# Patient Record
Sex: Female | Born: 1990 | Race: White | Hispanic: No | Marital: Married | Smoking: Never smoker
Health system: Southern US, Community
[De-identification: ages and names within clinical notes are randomized; demographics above are authoritative.]

## PROBLEM LIST (undated history)

## (undated) ENCOUNTER — Emergency Department (HOSPITAL_COMMUNITY)

## (undated) DIAGNOSIS — R21 Rash and other nonspecific skin eruption: Secondary | ICD-10-CM

## (undated) DIAGNOSIS — J309 Allergic rhinitis, unspecified: Secondary | ICD-10-CM

## (undated) DIAGNOSIS — K589 Irritable bowel syndrome without diarrhea: Secondary | ICD-10-CM

## (undated) DIAGNOSIS — Z789 Other specified health status: Secondary | ICD-10-CM

## (undated) DIAGNOSIS — E739 Lactose intolerance, unspecified: Secondary | ICD-10-CM

## (undated) HISTORY — DX: Irritable bowel syndrome, unspecified: K58.9

## (undated) HISTORY — DX: Rash and other nonspecific skin eruption: R21

## (undated) HISTORY — DX: Lactose intolerance, unspecified: E73.9

## (undated) HISTORY — PX: MOUTH SURGERY: SHX715

## (undated) HISTORY — DX: Allergic rhinitis, unspecified: J30.9

## (undated) HISTORY — PX: UPPER GASTROINTESTINAL ENDOSCOPY: SHX188

## (undated) HISTORY — PX: TONSILLECTOMY: SUR1361

## (undated) HISTORY — PX: WISDOM TOOTH EXTRACTION: SHX21

---
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
## 2012-11-11 ENCOUNTER — Ambulatory Visit (INDEPENDENT_AMBULATORY_CARE_PROVIDER_SITE_OTHER): Payer: Self-pay | Admitting: Otolaryngology

## 2012-12-03 ENCOUNTER — Ambulatory Visit: Payer: Self-pay | Admitting: Unknown Physician Specialty

## 2012-12-16 ENCOUNTER — Observation Stay: Payer: Self-pay | Admitting: Otolaryngology

## 2015-04-13 NOTE — Op Note (Signed)
PATIENT NAME:  Janet Gilmore, Rashaunda MR#:  [id number] DATE OF BIRTH:  1991-07-09  DATE OF PROCEDURE:  12/16/2012.  SURGEON: Zackery BarefootJ. Madison Desmen Schoffstall, M.D.   PREOPERATIVE DIAGNOSIS: Post- tonsillectomy hemorrhage.  POSTOPERATIVE DIAGNOSIS: Post- tonsillectomy hemorrhage.  PROCEDURE: Control of post- tonsillectomy hemorrhage.   DESCRIPTION OF PROCEDURE: The patient was placed in the supine position on the Operating Room table. After general endotracheal anesthesia had been induced, the patient was turned approximately 90 degrees clockwise from Anesthesia. A Dingman mouth retractor was placed and there was a large clot in the right inferior pole of the post tonsillectomy eschar. The clot was removed. There was brisk bleeding from a venous source, and this was cauterized with suction cautery. A Valsalva maneuver was performed by Anesthesia, no further bleeding was encountered. Therefore, attention was directed to sucking out the blood out of the stomach. Approximately 200 mL of black succus entericus was removed. The oropharynx was again irrigated. Nasopharynx was irrigated. A slight ooze from the junction between the right tongue base and pharyngeal wall. This was cauterized. No additional bleeding was encountered. Saline was used again to irrigate out the oropharynx. The patient was then returned to Anesthesia, allowed to emerge from anesthesia in the Operating Room, and taken to the Recovery Room in stable condition. There were no complications. Estimated blood loss 15 mL in the oropharynx and 200 mL in the stomach.   Of note, the patient has a bifid uvula.   ____________________________ J. Gertie BaronMadison Butch Otterson, MD [email or web address] D: 12/16/2012 21:14:44 ET T: 12/17/2012 10:47:17 ET JOB#: [id number]  cc: Zackery BarefootJ. Madison Markasia Carrol, MD, <Dictator> Wendee CoppJMADISON Tawfiq Favila MD ELECTRONICALLY SIGNED 12/27/2012 8:22

## 2017-11-07 ENCOUNTER — Other Ambulatory Visit: Payer: Self-pay | Admitting: Obstetrics and Gynecology

## 2017-12-12 ENCOUNTER — Other Ambulatory Visit: Payer: Self-pay | Admitting: Obstetrics and Gynecology

## 2017-12-30 ENCOUNTER — Ambulatory Visit (INDEPENDENT_AMBULATORY_CARE_PROVIDER_SITE_OTHER): Payer: BC Managed Care – PPO | Admitting: Obstetrics and Gynecology

## 2017-12-30 ENCOUNTER — Encounter: Payer: Self-pay | Admitting: Obstetrics and Gynecology

## 2017-12-30 VITALS — BP 124/62 | HR 76 | Ht 64.0 in | Wt 145.0 lb

## 2017-12-30 DIAGNOSIS — Z01419 Encounter for gynecological examination (general) (routine) without abnormal findings: Secondary | ICD-10-CM | POA: Diagnosis not present

## 2017-12-30 DIAGNOSIS — N921 Excessive and frequent menstruation with irregular cycle: Secondary | ICD-10-CM

## 2017-12-30 DIAGNOSIS — Z3041 Encounter for surveillance of contraceptive pills: Secondary | ICD-10-CM | POA: Diagnosis not present

## 2017-12-30 DIAGNOSIS — Z124 Encounter for screening for malignant neoplasm of cervix: Secondary | ICD-10-CM

## 2017-12-30 MED ORDER — LEVONORGESTREL-ETHINYL ESTRAD 0.1-20 MG-MCG PO TABS: 1.0000 | ORAL_TABLET | Freq: Every day | ORAL | 12 refills | Status: DC

## 2017-12-30 NOTE — Patient Instructions (Signed)
I value your feedback and entrusting us with your care. If you get a [facility] patient survey, I would appreciate you taking the time to let us know about your experience today. Thank you! 

## 2017-12-30 NOTE — Progress Notes (Signed)
PCP:  Carylon PerchesFagan, Roy, MD   Chief Complaint  Patient presents with  . Gynecologic Exam    piercing headaches 1-2 days before cycle  . Rash    Rashes/ bumps all over body     HPI:      [name] is a 27 y.o. G0P0000 who LMP was Patient's last menstrual period was 12/12/2017 (exact date)., presents today for her annual examination.  Her menses are regular every 28-30 days, lasting 5 days.  Dysmenorrhea none. She did have 1 wk of intermenstrual bleeding without late/missed OCPs last month. She has also had 2 months of menstrual headaches day before period started, relieved with NSAIDs--new sx for pt.   Sex activity: single partner, contraception - OCP (estrogen/progesterone).  Last Pap: December 11, 2016  Results were: no abnormalities  Hx of STDs: none  There is a FH of breast cancer in her PGM, genetic testing not indicated. There is no FH of ovarian cancer. The patient does not do self-breast exams.  Tobacco use: The patient denies current or previous tobacco use. Alcohol use: social drinker No drug use.  Exercise: moderately active  She does get adequate calcium and Vitamin D in her diet.   Past Medical History:  Diagnosis Date  . IBS (irritable bowel syndrome)   . Lactose intolerance   . Rash     Past Surgical History:  Procedure Laterality Date  . MOUTH SURGERY    . TONSILLECTOMY    . WISDOM TOOTH EXTRACTION      Family History  Problem Relation Age of Onset  . Lymphoma Father   . Breast cancer Paternal Grandmother [id number]    Social History   Socioeconomic History  . Marital status: Single    Spouse name: Not on file  . Number of children: Not on file  . Years of education: Not on file  . Highest education level: Not on file  Social Needs  . Financial resource strain: Not on file  . Food insecurity - worry: Not on file  . Food insecurity - inability: Not on file  . Transportation needs - medical: Not on file  . Transportation needs - non-medical:  Not on file  Occupational History  . Not on file  Tobacco Use  . Smoking status: Never Smoker  . Smokeless tobacco: Never Used  Substance and Sexual Activity  . Alcohol use: Yes  . Drug use: No  . Sexual activity: Yes    Partners: Male    Birth control/protection: Pill  Other Topics Concern  . Not on file  Social History Narrative  . Not on file    Current Meds  Medication Sig  . hyoscyamine (LEVSIN, ANASPAZ) 0.125 MG tablet Take by mouth.  . levonorgestrel-ethinyl estradiol (VIENVA) 0.1-20 MG-MCG tablet Take 1 tablet by mouth daily.  . [DISCONTINUED] VIENVA 0.1-20 MG-MCG tablet TAKE 1 TABLET BY MOUTH ONCE DAILY     ROS:  Review of Systems  Constitutional: Negative for fatigue, fever and unexpected weight change.  Respiratory: Negative for cough, shortness of breath and wheezing.   Cardiovascular: Negative for chest pain, palpitations and leg swelling.  Gastrointestinal: Negative for blood in stool, constipation, diarrhea, nausea and vomiting.  Endocrine: Negative for cold intolerance, heat intolerance and polyuria.  Genitourinary: Negative for dyspareunia, dysuria, flank pain, frequency, genital sores, hematuria, menstrual problem, pelvic pain, urgency, vaginal bleeding, vaginal discharge and vaginal pain.  Musculoskeletal: Negative for back pain, joint swelling and myalgias.  Skin: Negative for rash.  Neurological:  Positive for headaches. Negative for dizziness, syncope, light-headedness and numbness.  Hematological: Negative for adenopathy.  Psychiatric/Behavioral: Negative for agitation, confusion, sleep disturbance and suicidal ideas. The patient is not nervous/anxious.      Objective: BP 124/62 (BP Location: Left Arm, Patient Position: Sitting, Cuff Size: Normal)   Pulse 76   Ht 5\' 4"  (1.626 m)   Wt 145 lb (65.8 kg)   LMP 12/12/2017 (Exact Date)   BMI 24.89 kg/m    Physical Exam  Constitutional: She is oriented to person, place, and time. She appears  well-developed and well-nourished.  Genitourinary: Vagina normal and uterus normal. There is no rash or tenderness on the right labia. There is no rash or tenderness on the left labia. No erythema or tenderness in the vagina. No vaginal discharge found. Right adnexum does not display mass and does not display tenderness. Left adnexum does not display mass and does not display tenderness. Cervix does not exhibit motion tenderness or polyp. Uterus is not enlarged or tender.  Neck: Normal range of motion. No thyromegaly present.  Cardiovascular: Normal rate, regular rhythm and normal heart sounds.  No murmur heard. Pulmonary/Chest: Effort normal and breath sounds normal. Right breast exhibits no mass, no nipple discharge, no skin change and no tenderness. Left breast exhibits no mass, no nipple discharge, no skin change and no tenderness.  Abdominal: Soft. There is no tenderness. There is no guarding.  Musculoskeletal: Normal range of motion.  Neurological: She is alert and oriented to person, place, and time. No cranial nerve deficit.  Psychiatric: She has a normal mood and affect. Her behavior is normal.  Vitals reviewed.   Assessment/Plan: Encounter for annual routine gynecological examination  Cervical cancer screening - Plan: IGP, rfx Aptima HPV ASCU  Encounter for surveillance of contraceptive pills - OCP RF. - Plan: levonorgestrel-ethinyl estradiol (VIENVA) 0.1-20 MG-MCG tablet  Breakthrough bleeding on birth control pills - 1 mo only. F/u if sx persist for BC change.  Meds ordered this encounter  Medications  . levonorgestrel-ethinyl estradiol (VIENVA) 0.1-20 MG-MCG tablet    Sig: Take 1 tablet by mouth daily.    Dispense:  28 tablet    Refill:  12             GYN counsel adequate intake of calcium and vitamin D, diet and exercise     F/U  Return in about 1 year (around 12/30/2018).  Alicia B. Copland, PA-C 12/30/2017 4:45 PM

## 2018-01-01 LAB — IGP, RFX APTIMA HPV ASCU: PAP Smear Comment: 0

## 2018-02-25 ENCOUNTER — Ambulatory Visit (HOSPITAL_COMMUNITY)
Admission: RE | Admit: 2018-02-25 | Discharge: 2018-02-25 | Disposition: A | Discharge status: Home / Self Care | Payer: BC Managed Care – PPO | Source: Ambulatory Visit | Attending: Internal Medicine | Admitting: Internal Medicine

## 2018-02-25 ENCOUNTER — Other Ambulatory Visit (HOSPITAL_COMMUNITY): Payer: Self-pay | Admitting: Internal Medicine

## 2018-02-25 DIAGNOSIS — R1031 Right lower quadrant pain: Secondary | ICD-10-CM

## 2018-03-10 ENCOUNTER — Emergency Department
Admission: EM | Admit: 2018-03-10 | Discharge: 2018-03-10 | Disposition: A | Discharge status: Home / Self Care | Payer: BC Managed Care – PPO | Attending: Emergency Medicine | Admitting: Emergency Medicine

## 2018-03-10 ENCOUNTER — Encounter: Payer: Self-pay | Admitting: Emergency Medicine

## 2018-03-10 DIAGNOSIS — R197 Diarrhea, unspecified: Secondary | ICD-10-CM | POA: Insufficient documentation

## 2018-03-10 DIAGNOSIS — R1084 Generalized abdominal pain: Secondary | ICD-10-CM | POA: Insufficient documentation

## 2018-03-10 DIAGNOSIS — R112 Nausea with vomiting, unspecified: Secondary | ICD-10-CM | POA: Diagnosis present

## 2018-03-10 DIAGNOSIS — Z79899 Other long term (current) drug therapy: Secondary | ICD-10-CM | POA: Insufficient documentation

## 2018-03-10 LAB — COMPREHENSIVE METABOLIC PANEL
ALT: 17 U/L (ref 14–54)
AST: 24 U/L (ref 15–41)
Albumin: 4.7 g/dL (ref 3.5–5.0)
Alkaline Phosphatase: 57 U/L (ref 38–126)
Anion gap: 8 (ref 5–15)
BUN: 10 mg/dL (ref 6–20)
CO2: 26 mmol/L (ref 22–32)
Calcium: 9.3 mg/dL (ref 8.9–10.3)
Chloride: 105 mmol/L (ref 101–111)
Creatinine, Ser: 0.62 mg/dL (ref 0.44–1.00)
GFR calc Af Amer: >60 mL/min (ref >60)
GFR calc non Af Amer: >60 mL/min (ref >60)
Glucose, Bld: 103 mg/dL — ABNORMAL HIGH (ref 65–99)
Potassium: 4.4 mmol/L (ref 3.5–5.1)
Sodium: 139 mmol/L (ref 135–145)
Total Bilirubin: 0.6 mg/dL (ref 0.3–1.2)
Total Protein: 8.2 g/dL — ABNORMAL HIGH (ref 6.5–8.1)

## 2018-03-10 LAB — CBC
HCT: 44.1 % (ref 35.0–47.0)
Hemoglobin: 14.8 g/dL (ref 12.0–16.0)
MCH: 29.7 pg (ref 26.0–34.0)
MCHC: 33.5 g/dL (ref 32.0–36.0)
MCV: 88.7 fL (ref 80.0–100.0)
Platelets: 410 10*3/uL (ref 150–440)
RBC: 4.97 MIL/uL (ref 3.80–5.20)
RDW: 13.2 % (ref 11.5–14.5)
WBC: 8.9 10*3/uL (ref 3.6–11.0)

## 2018-03-10 LAB — URINALYSIS, COMPLETE (UACMP) WITH MICROSCOPIC
Bacteria, UA: NONE SEEN
Bilirubin Urine: NEGATIVE
Glucose, UA: NEGATIVE mg/dL
Hgb urine dipstick: NEGATIVE
Ketones, ur: NEGATIVE mg/dL
Leukocytes, UA: NEGATIVE
Nitrite: NEGATIVE
Protein, ur: NEGATIVE mg/dL
RBC / HPF: NONE SEEN RBC/hpf (ref 0–5)
Specific Gravity, Urine: 1.016 (ref 1.005–1.030)
WBC, UA: NONE SEEN WBC/hpf (ref 0–5)
pH: 7 (ref 5.0–8.0)

## 2018-03-10 LAB — PREGNANCY, URINE: Preg Test, Ur: NEGATIVE

## 2018-03-10 LAB — LIPASE, BLOOD: Lipase: 31 U/L (ref 11–51)

## 2018-03-10 MED ORDER — ONDANSETRON 4 MG PO TBDP
4.0000 mg | ORAL_TABLET | Freq: Once | ORAL | Status: AC
  Administered 2018-03-10: 4 mg via ORAL
  Filled 2018-03-10: qty 1

## 2018-03-10 MED ORDER — IBUPROFEN 600 MG PO TABS
600.0000 mg | ORAL_TABLET | Freq: Once | ORAL | Status: AC
  Administered 2018-03-10: 600 mg via ORAL
  Filled 2018-03-10: qty 1

## 2018-03-10 MED ORDER — ONDANSETRON 4 MG PO TBDP: 4.0000 mg | ORAL_TABLET | Freq: Three times a day (TID) | ORAL | 0 refills | Status: DC | PRN

## 2018-03-10 NOTE — ED Notes (Signed)
Dr. Don PerkingVeronese was discussing pt plan of care about IVF. Pt denies wanting an IV d/t being a hard stick. Discussed oral hydration with pt and she is agreeable to that.

## 2018-03-10 NOTE — ED Provider Notes (Signed)
[facility] Emergency Department Provider Note  ____________________________________________  Time seen: Approximately 11:37 AM  I have reviewed the triage vital signs and the nursing notes.   HISTORY  Chief Complaint Abdominal Pain   HPI Janet Gilmore is a 27 y.o. female with h/o IBS who presents for abdominal pain. Patient reports her symptoms have been intermittent since December, started again this morning, one episode of NBNB emesis and one watery diarrhea this am. No fever, chills, dysuria, vaginal discharge or bleeding. Pain is 4/10 crampy diffuse in her abdomen and constant since this am. Pain identical to prior episodes since December. Had CT 2 weeks ago showing constipation and mobile cecum. Has appointment with GI in 2 days. No prior abdominal surgeries. patient reports that she called her primary care doctor today who told her to come to the emergency room because she was vomiting.  Past Medical History:  Diagnosis Date  . IBS (irritable bowel syndrome)   . Lactose intolerance   . Rash     There are no active problems to display for this patient.   Past Surgical History:  Procedure Laterality Date  . MOUTH SURGERY    . TONSILLECTOMY    . WISDOM TOOTH EXTRACTION      Prior to Admission medications   Medication Sig Start Date End Date Taking? Authorizing Provider  albuterol (PROVENTIL HFA;VENTOLIN HFA) 108 (90 Base) MCG/ACT inhaler Inhale 1-2 puffs into the lungs every 6 (six) hours as needed for wheezing or shortness of breath.   Yes [provider]  hyoscyamine (LEVSIN, ANASPAZ) 0.125 MG tablet Take 0.125 mg by mouth daily.    Yes [provider]  levonorgestrel-ethinyl estradiol (VIENVA) 0.1-20 MG-MCG tablet Take 1 tablet by mouth daily. 12/30/17  Yes Copland, Ilona Sorrel, PA-C  Probiotic Product (PROBIOTIC DAILY PO) Take 1 tablet by mouth daily.   Yes [provider]  ondansetron (ZOFRAN ODT) 4 MG  disintegrating tablet Take 1 tablet (4 mg total) by mouth every 8 (eight) hours as needed for nausea or vomiting. 03/10/18   Nita Sickle, MD    Allergies Patient has no known allergies.  Family History  Problem Relation Age of Onset  . Lymphoma Father   . Breast cancer Paternal Grandmother 59    Social History Social History   Tobacco Use  . Smoking status: Never Smoker  . Smokeless tobacco: Never Used  Substance Use Topics  . Alcohol use: Yes  . Drug use: No    Review of Systems  Constitutional: Negative for fever. Eyes: Negative for visual changes. ENT: Negative for sore throat. Neck: No neck pain  Cardiovascular: Negative for chest pain. Respiratory: Negative for shortness of breath. Gastrointestinal: + abdominal pain, vomiting and diarrhea. Genitourinary: Negative for dysuria. Musculoskeletal: Negative for back pain. Skin: Negative for rash. Neurological: Negative for headaches, weakness or numbness. Psych: No SI or HI  ____________________________________________   PHYSICAL EXAM:  VITAL SIGNS: ED Triage Vitals  Enc Vitals Group     BP 03/10/18 0955 (!) 145/99     Pulse Rate 03/10/18 0955 92     Resp 03/10/18 0955 20     Temp 03/10/18 0959 98.4 F (36.9 C)     Temp Source 03/10/18 0959 Oral     SpO2 03/10/18 0955 100 %     Weight 03/10/18 0955 145 lb (65.8 kg)     Height --      Head Circumference --      Peak Flow --  Pain Score 03/10/18 0955 3     Pain Loc --      Pain Edu? --      Excl. in GC? --     Constitutional: Alert and oriented. Well appearing and in no apparent distress. HEENT:      Head: Normocephalic and atraumatic.         Eyes: Conjunctivae are normal. Sclera is non-icteric.       Mouth/Throat: Mucous membranes are moist.       Neck: Supple with no signs of meningismus. Cardiovascular: Regular rate and rhythm. No murmurs, gallops, or rubs. 2+ symmetrical distal pulses are present in all extremities. No  JVD. Respiratory: Normal respiratory effort. Lungs are clear to auscultation bilaterally. No wheezes, crackles, or rhonchi.  Gastrointestinal: Soft, diffuse mild tenderness to palpation, and non distended with positive bowel sounds. No rebound or guarding. Genitourinary: No CVA tenderness. Musculoskeletal: Nontender with normal range of motion in all extremities. No edema, cyanosis, or erythema of extremities. Neurologic: Normal speech and language. Face is symmetric. Moving all extremities. No gross focal neurologic deficits are appreciated. Skin: Skin is warm, dry and intact. No rash noted. Psychiatric: Mood and affect are normal. Speech and behavior are normal.  ____________________________________________   LABS (all labs ordered are listed, but only abnormal results are displayed)  Labs Reviewed  COMPREHENSIVE METABOLIC PANEL - Abnormal; Notable for the following components:      Result Value   Glucose, Bld 103 (*)    Total Protein 8.2 (*)    All other components within normal limits  URINALYSIS, COMPLETE (UACMP) WITH MICROSCOPIC - Abnormal; Notable for the following components:   Color, Urine YELLOW (*)    APPearance CLEAR (*)    Squamous Epithelial / LPF 0-5 (*)    All other components within normal limits  LIPASE, BLOOD  CBC  PREGNANCY, URINE   ____________________________________________  EKG  none ____________________________________________  RADIOLOGY  none  ____________________________________________   PROCEDURES  Procedure(s) performed: None Procedures Critical Care performed:  None ____________________________________________   INITIAL IMPRESSION / ASSESSMENT AND PLAN / ED COURSE  27 y.o. female with h/o IBS who presents for abdominal pain intermittent since December. Patient had a CT scan 2 weeks ago showing constipation and mobile cecum. Pain is identical to prior episodes. Vitals are within normal limits, abdomen is soft with mild diffuse  tenderness, no localized tenderness, rebound or guarding. Labs show no white count, and normal CMP, lipase, urinalysis. negative pregnancy test. At this time will believe patient's symptoms are either from gastroenteritis vs IBS flare. No clinical suspicion for ovarian pathology, gallbladder pathology, appendicitis, diverticulitis. Do not believe patient warrants any further imaging at this time since her symptoms have been ongoing since December and she had a unremarkable CT 2 weeks ago. I explained to her signs and symptoms of appendicitis, gallbladder, and ovarian pathology and recommended that she returns to the emergency room if these develop. Otherwise she will follow-up with her GI doctor in 2 days on her already scheduled appointment. I offered to put an IV to give her fluids, Toradol and Zofran. Patient refused IV and preferred to be treated orally. Will give ibuprofen, Zofran and by mouth challenge. If patient passes by mouth challenge anticipate discharge home.  Clinical Course as of Mar 10 1942  Wed Mar 10, 2018  1258 patient tolerating by mouth. Remains well appearing with no focal tenderness on abdominal exam. At this time she is safe for discharge home. We'll provide a prescription of Zofran.  Discussed strict return precautions and close follow-up with her GI doctor.  [CV]    Clinical Course User Index [CV] Don PerkingVeronese, [place], MD     As part of my medical decision making, I reviewed the following data within the electronic MEDICAL RECORD NUMBER Nursing notes reviewed and incorporated, Labs reviewed , Notes from prior ED visits and [place] Controlled Substance Database    Pertinent labs & imaging results that were available during my care of the patient were reviewed by me and considered in my medical decision making (see chart for details).    ____________________________________________   FINAL CLINICAL IMPRESSION(S) / ED DIAGNOSES  Final diagnoses:  Nausea vomiting and diarrhea   Generalized abdominal pain      NEW MEDICATIONS STARTED DURING THIS VISIT:  ED Discharge Orders        Ordered    ondansetron (ZOFRAN ODT) 4 MG disintegrating tablet  Every 8 hours PRN     03/10/18 1259       Note:  This document was prepared using Dragon voice recognition software and may include unintentional dictation errors.    Don PerkingVeronese, [place], MD 03/10/18 [id number]

## 2018-03-10 NOTE — ED Triage Notes (Signed)
Pt with abd pain started last night, pt with hx of IBS.

## 2018-03-10 NOTE — ED Notes (Addendum)
Pt states abd pain RLQ and upper middle and some LLQ. States started in December, has seen multiple doctors about it. Pain has become consistent past few weeks. States had a CT from PCP done and was told stool in colon and mobile cecum. States over night had vomiting and diarrhea. PCP told pt to take dulcolax for 4 days. Pt admits to IBS. Denies constipation. Has consult on Friday with GI doctor. Mom states pt feels hot. Pt states BM have been "soft solid" not just liquid. Describes pain as cramping.

## 2018-03-10 NOTE — ED Notes (Signed)
Pt given ginger ale for PO hydration.

## 2018-03-10 NOTE — ED Notes (Signed)
First Nurse Note:  Patient complaining of abdominal pain, states Dr. [place]agan [place] advised her to come to ED.  Alert and oriented.

## 2018-03-10 NOTE — Discharge Instructions (Signed)

## 2018-03-10 NOTE — ED Notes (Signed)
Pt ambulatory to treatment room, no distress noted.   

## 2018-03-12 ENCOUNTER — Ambulatory Visit: Payer: BC Managed Care – PPO | Admitting: Gastroenterology

## 2018-03-12 ENCOUNTER — Encounter: Payer: Self-pay | Admitting: Gastroenterology

## 2018-03-12 VITALS — BP 116/70 | HR 80 | Ht 63.58 in | Wt 145.2 lb

## 2018-03-12 DIAGNOSIS — R112 Nausea with vomiting, unspecified: Secondary | ICD-10-CM | POA: Diagnosis not present

## 2018-03-12 DIAGNOSIS — R194 Change in bowel habit: Secondary | ICD-10-CM | POA: Diagnosis not present

## 2018-03-12 DIAGNOSIS — R1084 Generalized abdominal pain: Secondary | ICD-10-CM

## 2018-03-12 MED ORDER — GLYCOPYRROLATE 1 MG PO TABS: 1.0000 mg | ORAL_TABLET | Freq: Two times a day (BID) | ORAL | 11 refills | Status: DC

## 2018-03-12 NOTE — Progress Notes (Signed)
History of Present Illness: This is a 27 year old female referred by Carylon PerchesFagan, Roy, MD for the evaluation of abdominal pain. She has a history of diarrhea typically occurring after meals for the past 3 years and this has not changed.  She did not note abdominal pain until December. She relates intermittent episodes of diffuse, crampy abdominal pain since December. She has had 2 episodes of N/V associated with more severe episodes of abdominal pain.  She relates frequent abdominal bloating since December.  CMP, CBC, lipase, UA, urine [facility] test [facility] 2 days ago - all unremarkable. She was given ibuprofen and Zofran. CT as below. Denies weight loss, change in stool caliber, melena, hematochezia, dysphagia, reflux symptoms, chest pain.  Abd/pelvic CT 02/25/2018: Normal unenhanced CT of the abdomen and pelvis. Large stool burden throughout the normal appearing colon. Mobile cecum extends nearly to the midline of the lower pelvis.    No Known Allergies Outpatient Medications Prior to Visit  Medication Sig Dispense Refill  . hyoscyamine (LEVSIN, ANASPAZ) 0.125 MG tablet Take 0.125 mg by mouth daily.     Marland Kitchen. levocetirizine (XYZAL) 5 MG tablet Take 5 mg by mouth daily.    Marland Kitchen. levonorgestrel-ethinyl estradiol (VIENVA) 0.1-20 MG-MCG tablet Take 1 tablet by mouth daily. 28 tablet 12  . Multiple Vitamin (MULTIVITAMIN) tablet Take 1 tablet by mouth daily.    . Probiotic Product (PROBIOTIC DAILY PO) Take 1 tablet by mouth daily.    Marland Kitchen. albuterol (PROVENTIL HFA;VENTOLIN HFA) 108 (90 Base) MCG/ACT inhaler Inhale 1-2 puffs into the lungs every 6 (six) hours as needed for wheezing or shortness of breath.    . ondansetron (ZOFRAN ODT) 4 MG disintegrating tablet Take 1 tablet (4 mg total) by mouth every 8 (eight) hours as needed for nausea or vomiting. (Patient not taking: Reported on 03/12/2018) 20 tablet 0   No facility-administered medications prior to visit.    Past Medical History:  Diagnosis Date  .  Allergic rhinitis   . IBS (irritable bowel syndrome)   . Lactose intolerance    symptamatic but not diagnosed  . Rash    Past Surgical History:  Procedure Laterality Date  . MOUTH SURGERY     x 3  . TONSILLECTOMY    . WISDOM TOOTH EXTRACTION     Social History   Socioeconomic History  . Marital status: Single    Spouse name: Not on file  . Number of children: 0  . Years of education: Not on file  . Highest education level: Not on file  Occupational History  . Occupation: Runner, broadcasting/film/videoteacher  Social Needs  . Financial resource strain: Not on file  . Food insecurity:    Worry: Not on file    Inability: Not on file  . Transportation needs:    Medical: Not on file    Non-medical: Not on file  Tobacco Use  . Smoking status: Never Smoker  . Smokeless tobacco: Never Used  Substance and Sexual Activity  . Alcohol use: Yes    Comment: soacial  . Drug use: No  . Sexual activity: Yes    Partners: Male    Birth control/protection: Pill  Lifestyle  . Physical activity:    Days per week: 2 days    Minutes per session: 40 min  . Stress: To some extent  Relationships  . Social connections:    Talks on phone: More than three times a week    Gets together: Once a week  Attends religious service: More than 4 times per year    Active member of club or organization: Yes    Attends meetings of clubs or organizations: More than 4 times per year    Relationship status: Never married  Other Topics Concern  . Not on file  Social History Narrative  . Not on file   Family History  Problem Relation Age of Onset  . Non-Hodgkin's lymphoma Father   . Colon polyps Father   . Breast cancer Paternal Grandmother 15  . Colon polyps Mother   . Irritable bowel syndrome Mother   . GER disease Brother   . Dementia Maternal Grandmother   . Heart disease Maternal Grandfather   . Diabetes Paternal Grandfather      Review of Systems: Pertinent positive and negative review of systems were noted in  the above HPI section. All other review of systems were otherwise negative.   Physical Exam: General: Well developed, well nourished, no acute distress Head: Normocephalic and atraumatic Eyes:  sclerae anicteric, EOMI Ears: Normal auditory acuity Mouth: No deformity or lesions Neck: Supple, no masses or thyromegaly Lungs: Clear throughout to auscultation Heart: Regular rate and rhythm; no murmurs, rubs or bruits Abdomen: Soft, diffuse mild tender and non distended. No masses, hepatosplenomegaly or hernias noted. Normal Bowel sounds Rectal: not done Musculoskeletal: Symmetrical with no gross deformities  Skin: No lesions on visible extremities Pulses:  Normal pulses noted Extremities: No clubbing, cyanosis, edema or deformities noted Neurological: Alert oriented x 4, grossly nonfocal Cervical Nodes:  No significant cervical adenopathy Inguinal Nodes: No significant inguinal adenopathy Psychological:  Alert and cooperative. Anxious.   Assessment and Recommendations:  1. Intermittent abdominal pain, abdominal bloating, change in bowel habits. 2 episodes of nausea and vomiting likely related to abdominal pain.  Even though she relates postprandial loose stools her CT scan indicates that her colon is not completely evacuating.  Likely has constipation, incomplete fecal evacuation. Begin MiraLAX 2-3 times daily over the weekend and then once daily titrated for complete bowel movement each day.  Begin glycopyrrolate 1 mg twice daily.  May use hyoscyamine as needed.  Call for further advice if symptoms not under good control next week. REV in 1 month.    cc: Carylon Perches, MD [address] [place], [place] [address]

## 2018-03-12 NOTE — Patient Instructions (Signed)
If you are age 27 or older, your body mass index should be between 23-30. Your Body mass index is 25.26 kg/m. If this is out of the aforementioned range listed, please consider follow up with your Primary Care Provider.  If you are age 27 or younger, your body mass index should be between 19-25. Your Body mass index is 25.26 kg/m. If this is out of the aformentioned range listed, please consider follow up with your Primary Care Provider.   We have sent the following medications to your pharmacy for you to pick up at your convenience: Robinul 1mg  twice daily   Please purchase the following medications over the counter and take as directed: Miralax- Use 2-3 times a day throughout the weekend and then start once daily.

## 2018-03-16 ENCOUNTER — Other Ambulatory Visit: Payer: Self-pay

## 2018-03-16 ENCOUNTER — Telehealth: Payer: Self-pay | Admitting: Gastroenterology

## 2018-03-16 MED ORDER — PLECANATIDE 3 MG PO TABS: 3.0000 mg | ORAL_TABLET | Freq: Every day | ORAL | 1 refills | Status: DC

## 2018-03-16 NOTE — Telephone Encounter (Signed)
Trulance 3 mg po daily. Stop Miralax if having diarrhea.

## 2018-03-16 NOTE — Telephone Encounter (Signed)
Patient okay'd to leave voice message. LVM that I called in Trulance Rx for her, she should stop the Miralax if she starts having diarrhea. Asked to her to give it a good week to see how this medication is working for her, call if symptoms not improved.

## 2018-03-16 NOTE — Telephone Encounter (Signed)
Patient states she took 5 doses of Miralax on Saturday, felt completely cleaned out. She had a normal bm on Sunday night. She has been taking miralax daily, taking her glycopyrrolate and hyoscyamine. She is still continuing to have intermittent, generalized abdominal pain. Today it was RUQ pain 1.5 hours after eating. Please advise.

## 2018-03-17 ENCOUNTER — Ambulatory Visit: Payer: BC Managed Care – PPO | Admitting: Physician Assistant

## 2018-03-17 ENCOUNTER — Encounter: Payer: Self-pay | Admitting: Physician Assistant

## 2018-03-17 ENCOUNTER — Telehealth: Payer: Self-pay

## 2018-03-17 VITALS — BP 120/64 | HR 80 | Ht 63.58 in | Wt 145.4 lb

## 2018-03-17 DIAGNOSIS — R112 Nausea with vomiting, unspecified: Secondary | ICD-10-CM

## 2018-03-17 DIAGNOSIS — R1013 Epigastric pain: Secondary | ICD-10-CM

## 2018-03-17 DIAGNOSIS — R109 Unspecified abdominal pain: Secondary | ICD-10-CM | POA: Diagnosis not present

## 2018-03-17 MED ORDER — PANTOPRAZOLE SODIUM 40 MG PO TBEC: 40.0000 mg | DELAYED_RELEASE_TABLET | Freq: Every day | ORAL | 3 refills | Status: DC

## 2018-03-17 NOTE — Progress Notes (Signed)
Subjective:    Patient ID: Janet Gilmore, female    DOB: [birth year], 27 y.o.   MRN: [id number]  HPI Janet Gilmore is a pleasant 27 year old white female, known just recently to Dr. Russella DarStark and seen on 03/12/2018 for complaints of intermittent abdominal pain bloating and change in bowel habits.  She had also had a couple of episodes of nausea and vomiting.  Symptoms had started in December. She comes back in today for follow-up with ongoing pain. 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  She has had recent labs which were unremarkable including pregnancy test. When she was seen in the office on 03/12/2018 she was started with treatment for constipation and asked to purge her bowel with MiraLAX and she says she took 5 doses of MiraLAX this past weekend and one day and had multiple bowel movements to the point of body liquid stool.  She says that did not change her pain symptoms.  She is also been using Robinul 1 mg twice daily in addition to hyoscyamine as needed. He has continued on MiraLAX once daily this week.  She did have a good bowel movement yesterday. Patient says she had an acute episode of intense sharp stabbing type pain in the upper abdomen that awakened her from sleep at about 3 AM last night and lasted till about 5 AM.  She says these episodes are intense and cause her to double over with pain.  She has not had any radiation into her chest or back.  Also just this week she is started having some separate right lateral abdominal discomfort off and on throughout the day.  She did become lot nauseated early this morning with this episode and vomited. She also had a similar episode last Wednesday, 03/10/2018 and awakening her in the middle of the night lasting for a couple of hours and associated with nausea and vomiting of which she  says was undigested food. Patient says she has been having some milder stabbing pain off and on throughout the day but her worst episodes have been during the night. She has not been using any aspirin or NSAIDs, no regular EtOH and denies any marijuana use.  Review of Systems Pertinent positive and negative review of systems were noted in the above HPI section.  All other review of systems was otherwise negative.  Outpatient Encounter Medications as of 03/17/2018  Medication Sig  . hyoscyamine (LEVSIN, ANASPAZ) 0.125 MG tablet Take 0.125 mg by mouth daily.   Marland Kitchen. levocetirizine (XYZAL) 5 MG tablet Take 5 mg by mouth daily.  Marland Kitchen. levonorgestrel-ethinyl estradiol (VIENVA) 0.1-20 MG-MCG tablet Take 1 tablet by mouth daily.  . Multiple Vitamin (MULTIVITAMIN) tablet Take 1 tablet by mouth daily.  . Probiotic Product (PROBIOTIC DAILY PO) Take 1 tablet by mouth daily.  Marland Kitchen. albuterol (PROVENTIL HFA;VENTOLIN HFA) 108 (90 Base) MCG/ACT inhaler Inhale 1-2 puffs into the lungs every 6 (six) hours as needed for wheezing or shortness of breath.  Marland Kitchen. glycopyrrolate (ROBINUL) 1 MG tablet Take 1 tablet (1 mg total) by mouth 2 (two) times daily. (Patient not taking: Reported on 03/17/2018)  . ondansetron (ZOFRAN ODT) 4 MG disintegrating tablet Take 1 tablet (4 mg total) by mouth every 8 (eight) hours as needed for nausea or vomiting. (Patient not taking: Reported on 03/12/2018)  . Plecanatide (TRULANCE) 3 MG  TABS Take 3 mg by mouth daily. (Patient not taking: Reported on 03/17/2018)   No facility-administered encounter medications on file as of 03/17/2018.    No Known Allergies There are no active problems to display for this patient.  Social History   Socioeconomic History  . Marital status: Single    Spouse name: Not on file  . Number of children: 0  . Years of education: Not on file  . Highest education level: Not on file  Occupational History  . Occupation: Runner, broadcasting/film/video  Social Needs  . Financial resource strain:  Not on file  . Food insecurity:    Worry: Not on file    Inability: Not on file  . Transportation needs:    Medical: Not on file    Non-medical: Not on file  Tobacco Use  . Smoking status: Never Smoker  . Smokeless tobacco: Never Used  Substance and Sexual Activity  . Alcohol use: Yes    Comment: soacial  . Drug use: No  . Sexual activity: Yes    Partners: Male    Birth control/protection: Pill  Lifestyle  . Physical activity:    Days per week: 2 days    Minutes per session: 40 min  . Stress: To some extent  Relationships  . Social connections:    Talks on phone: More than three times a week    Gets together: Once a week    Attends religious service: More than 4 times per year    Active member of club or organization: Yes    Attends meetings of clubs or organizations: More than 4 times per year    Relationship status: Never married  . Intimate partner violence:    Fear of current or ex partner: No    Emotionally abused: No    Physically abused: No    Forced sexual activity: No  Other Topics Concern  . Not on file  Social History Narrative  . Not on file    Janet Gilmore's family history includes Breast cancer (age of onset: 39) in her paternal grandmother; Colon polyps in her father and mother; Dementia in her maternal grandmother; Diabetes in her paternal grandfather; GER disease in her brother; Heart disease in her maternal grandfather; Irritable bowel syndrome in her mother; Non-Hodgkin's lymphoma in her father.      Objective:    Vitals:   03/17/18 1500  BP: 120/64  Pulse: 80    Physical Exam; well-developed young white female in no acute distress, pleasant blood pressure 120/64 pulse 80, height 5 foot 3, weight 145, BMI 25.2.  HEENT ;nontraumatic normocephalic EOMI PERRLA sclera anicteric, Cardiovascular; regular rate and rhythm with S1-S2 no murmur rub or gallop, Pulmonary ;clear bilaterally, Abdomen ;soft, non-distended, no palpable mass or hepatosplenomegaly,  bowel sounds are present she is tender in the epigastrium and right upper quadrant no guarding or rebound, Rectal ;exam not done, Extremities ;no clubbing cyanosis or edema skin warm dry, Neuro psych; mood and affect appropriate       Assessment & Plan:   #59 27 year old white female with 3-34-month history of intermittent abdominal pain now with episodes of intense upper abdominal pain described as stabbing in nature usually occurring in the middle of the night and associated with nausea and vomiting.  She has had 2 episodes over the past week. She was compliant with purging her bowel but did not have any change in symptoms. Do not think these intense episodes are secondary to IBS. Will rule out gallbladder disease,  gastritis or peptic ulcer disease  #2 constipation-patient had good result with bowel purge with MiraLAX.  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 Russella Dar.  Procedure was discussed in detail with the patient including indications risks and benefits and she is agreeable to proceed Start Protonix 40 mg p.o. every morning AC breakfast She will continue glycopyrrolate 1 mg p.o. twice daily Advised her to continue MiraLAX 17 g in 8 ounces of water daily or every other day, and will hold off on Trulance in the short-term. Bland diet Further plans pending results of above.  Jamieon Lannen Oswald Hillock PA-C 03/17/2018   Cc: Carylon Perches, MD

## 2018-03-17 NOTE — Telephone Encounter (Signed)
Received fax from [facility] stating PA for Trulance 3 mg is approved from 03/17/18-03/17/2021 for idiopathic constipation.

## 2018-03-17 NOTE — Telephone Encounter (Signed)
Patient reports that she was awakened in the middle of the night with terrible abdominal pain and still is having pain.  She reports pain is an 8-10 and had vomiting earlier this am as well.  She will come in and see Amy Esterwood PA today at 3:00

## 2018-03-17 NOTE — Patient Instructions (Signed)
Normal BMI (Body Mass Index- based on height and weight) is between 19 and 25. Your BMI today is Body mass index is 25.28 kg/m. Marland Kitchen. Please consider follow up  regarding your BMI with your Primary Care Provider.  You have been scheduled for an endoscopy. Please follow written instructions given to you at your visit today. If you use inhalers (even only as needed), please bring them with you on the day of your procedure. Your physician has requested that you go to www.startemmi.com and enter the access code given to you at your visit today. This web site gives a general overview about your procedure. However, you should still follow specific instructions given to you by our office regarding your preparation for the procedure.  We have sent the following medications to your pharmacy for you to pick up at your convenience:  [facility] have [facility] of [facility] on 03/22/18 at 10:00am. Please arrive 15 minutes prior to your appointment for registration. Make certain not to have anything to eat or drink 6 hours prior to your appointment. Should you need to reschedule your appointment, please contact radiology at [phone]. This test typically takes about 30 minutes to perform.

## 2018-03-17 NOTE — Telephone Encounter (Signed)
Patient calling back stating last night she woke from a deep sleep around 3am and was in pain at level 10/10. Patient states she also had BMs every hour until about 5 and then threw up. Pt seems to think this is urgent and would like to speak with nurse again. Patient has not picked up new medication yet to try.

## 2018-03-18 NOTE — Progress Notes (Signed)
Reviewed and agree with initial management plan.  Malcolm T. Stark, MD FACG 

## 2018-03-19 ENCOUNTER — Ambulatory Visit (AMBULATORY_SURGERY_CENTER): Payer: BC Managed Care – PPO | Admitting: Gastroenterology

## 2018-03-19 ENCOUNTER — Other Ambulatory Visit: Payer: Self-pay

## 2018-03-19 ENCOUNTER — Encounter: Payer: Self-pay | Admitting: Gastroenterology

## 2018-03-19 VITALS — BP 115/62 | HR 77 | Temp 99.1°F | Resp 17 | Ht 63.0 in | Wt 145.0 lb

## 2018-03-19 DIAGNOSIS — R112 Nausea with vomiting, unspecified: Secondary | ICD-10-CM

## 2018-03-19 DIAGNOSIS — R1013 Epigastric pain: Secondary | ICD-10-CM

## 2018-03-19 MED ORDER — SODIUM CHLORIDE 0.9 % IV SOLN: 500.0000 mL | Freq: Once | INTRAVENOUS | Status: DC

## 2018-03-19 NOTE — Patient Instructions (Addendum)
YOU HAD AN ENDOSCOPIC PROCEDURE TODAY AT THE [facility]:   Refer to the procedure report that was given to you for any specific questions about what was found during the examination.  If the procedure report does not answer your questions, please call your gastroenterologist to clarify.  If you requested that your care partner not be given the details of your procedure findings, then the procedure report has been included in a sealed envelope for you to review at your convenience later.  YOU SHOULD EXPECT: Some feelings of bloating in the abdomen. Passage of more gas than usual.  Walking can help get rid of the air that was put into your GI tract during the procedure and reduce the bloating. If you had a lower endoscopy (such as a colonoscopy or flexible sigmoidoscopy) you may notice spotting of blood in your stool or on the toilet paper. If you underwent a bowel prep for your procedure, you may not have a normal bowel movement for a few days.  Please Note:  You might notice some irritation and congestion in your nose or some drainage.  This is from the oxygen used during your procedure.  There is no need for concern and it should clear up in a day or so.  SYMPTOMS TO REPORT IMMEDIATELY:   Following upper endoscopy (EGD)  Vomiting of blood or coffee ground material  New chest pain or pain under the shoulder blades  Painful or persistently difficult swallowing  New shortness of breath  Fever of 100F or higher  Black, tarry-looking stools  For urgent or emergent issues, a gastroenterologist can be reached at any hour by calling [phone].   DIET:  We do recommend a small meal at first, but then you may proceed to your regular diet.  Drink plenty of fluids but you should avoid alcoholic beverages for 24 hours.  ACTIVITY:  You should plan to take it easy for the rest of today and you should NOT DRIVE or use heavy machinery until tomorrow (because of the sedation medicines used  during the test).    FOLLOW UP: Our staff will call the number listed on your records the next business day following your procedure to check on you and address any questions or concerns that you may have regarding the information given to you following your procedure. If we do not reach you, we will leave a message.  However, if you are feeling well and you are not experiencing any problems, there is no need to return our call.  We will assume that you have returned to your regular daily activities without incident.  If any biopsies were taken you will be contacted by phone or by letter within the next 1-3 weeks.  Please call us at [phone] if you have not heard about the biopsies in 3 weeks.    SIGNATURES/CONFIDENTIALITY: You and/or your care partner have signed paperwork which will be entered into your electronic medical record.  These signatures attest to the fact that that the information above on your After Visit Summary has been reviewed and is understood.  Full responsibility of the confidentiality of this discharge information lies with you and/or your care-partner.   Thank you for allowing us to provide your healthcare today.  

## 2018-03-19 NOTE — Progress Notes (Signed)
History reviewed today 

## 2018-03-19 NOTE — Progress Notes (Signed)
Report to PACU, RN, vss, BBS= Clear.  

## 2018-03-19 NOTE — Op Note (Signed)
[facility] Patient Name: Janet Gilmore Procedure Date: 03/19/2018 3:09 PM MRN: [id number] Endoscopist: Meryl DareMalcolm T Gali Spinney , MD Age: [age] Referring MD:  Date of Birth: November 26, 1991 Gender: Female Account #: [id number] Procedure:                Upper GI endoscopy Indications:              Epigastric abdominal pain, Nausea with vomiting Medicines:                Monitored Anesthesia Care Procedure:                Pre-Anesthesia Assessment:                           - Prior to the procedure, a History and Physical                            was performed, and patient medications and                            allergies were reviewed. The patient's tolerance of                            previous anesthesia was also reviewed. The risks                            and benefits of the procedure and the sedation                            options and risks were discussed with the patient.                            All questions were answered, and informed consent                            was obtained. Prior Anticoagulants: The patient has                            taken no previous anticoagulant or antiplatelet                            agents. ASA Grade Assessment: II - A patient with                            mild systemic disease. After reviewing the risks                            and benefits, the patient was deemed in                            satisfactory condition to undergo the procedure.                           After obtaining informed consent, the endoscope was  passed under direct vision. Throughout the                            procedure, the patient's blood pressure, pulse, and                            oxygen saturations were monitored continuously. The                            Endoscope was introduced through the mouth, and                            advanced to the second part of duodenum. The upper                            GI  endoscopy was accomplished without difficulty.                            The patient tolerated the procedure well. Scope In: Scope Out: Findings:                 The esophagus was normal.                           The stomach was normal.                           The examined duodenum was normal.                           The cardia and gastric fundus were normal on                            retroflexion. Complications:            No immediate complications. Estimated Blood Loss:     Estimated blood loss: none. Impression:               - Normal esophagus.                           - Normal stomach.                           - Normal examined duodenum.                           - No specimens collected. Recommendation:           - Patient has a contact number available for                            emergencies. The signs and symptoms of potential                            delayed complications were discussed with the  patient. Return to normal activities tomorrow.                            Written discharge instructions were provided to the                            patient.                           - Resume previous diet.                           - Continue present medications.                           - Abd [place] as scheduled. Meryl Dare, MD 03/19/2018 3:31:51 PM This report has been signed electronically.

## 2018-03-22 ENCOUNTER — Telehealth: Payer: Self-pay | Admitting: *Deleted

## 2018-03-22 ENCOUNTER — Ambulatory Visit (HOSPITAL_COMMUNITY): Payer: BC Managed Care – PPO

## 2018-03-22 ENCOUNTER — Ambulatory Visit (HOSPITAL_COMMUNITY)
Admission: RE | Admit: 2018-03-22 | Discharge: 2018-03-22 | Disposition: A | Discharge status: Home / Self Care | Payer: BC Managed Care – PPO | Source: Ambulatory Visit | Attending: Physician Assistant | Admitting: Physician Assistant

## 2018-03-22 ENCOUNTER — Telehealth: Payer: Self-pay

## 2018-03-22 DIAGNOSIS — R1013 Epigastric pain: Secondary | ICD-10-CM

## 2018-03-22 DIAGNOSIS — R112 Nausea with vomiting, unspecified: Secondary | ICD-10-CM | POA: Diagnosis present

## 2018-03-22 DIAGNOSIS — R109 Unspecified abdominal pain: Secondary | ICD-10-CM | POA: Insufficient documentation

## 2018-03-22 NOTE — Telephone Encounter (Signed)
Error

## 2018-03-22 NOTE — Telephone Encounter (Signed)
  Follow up Call-  Call back number 03/19/2018  Post procedure Call Back phone  # [phone]  Permission to leave phone message Yes  Some recent data might be hidden     Patient questions:  Do you have a fever, pain , or abdominal swelling? No. Pain Score  0 *  Have you tolerated food without any problems? Yes.    Have you been able to return to your normal activities? Yes.    Do you have any questions about your discharge instructions: Diet   No. Medications  No. Follow up visit  No.  Do you have questions or concerns about your Care? No.  Actions: * If pain score is 4 or above: No action needed, pain <4.

## 2018-04-15 ENCOUNTER — Encounter: Payer: BC Managed Care – PPO | Admitting: Gastroenterology

## 2018-04-20 ENCOUNTER — Ambulatory Visit: Payer: BC Managed Care – PPO | Admitting: Gastroenterology

## 2018-04-20 ENCOUNTER — Encounter: Payer: Self-pay | Admitting: Gastroenterology

## 2018-04-20 VITALS — BP 110/66 | HR 80 | Ht 63.0 in | Wt 152.2 lb

## 2018-04-20 DIAGNOSIS — K588 Other irritable bowel syndrome: Secondary | ICD-10-CM

## 2018-04-20 NOTE — Patient Instructions (Signed)
Stop taking your pantoprazole in one month.   Continue glycopyrrolate.   Normal BMI (Body Mass Index- based on height and weight) is between 19 and 25. Your BMI today is Body mass index is 26.96 kg/m. Marland Kitchen Please consider follow up  regarding your BMI with your Primary Care Provider.   Thank you for choosing me and [facility].  Venita Lick. Pleas Koch., MD., Clementeen Graham

## 2018-04-20 NOTE — Progress Notes (Signed)
    History of Present Illness: This is a 27 year old female with abdominal pain.  Symptoms have improved substantially on current medications.  She is having regular bowel movements.  She does have occasional mild upper abdominal discomfort that lasts only a few minutes.  EGD 02/2018 - Normal esophagus. - Normal stomach. - Normal examined duodenum. - No specimens collected.  Abd [place] 03/2018 Normal abdomen ultrasound  Abd/pelvic CT 02/2018 Normal unenhanced CT of the abdomen and pelvis   Current Medications, Allergies, Past Medical History, Past Surgical History, Family History and Social History were reviewed in [employer] record.  Physical Exam: General: Well developed, well nourished, no acute distress Head: Normocephalic and atraumatic Eyes:  sclerae anicteric, EOMI Ears: Normal auditory acuity Mouth: No deformity or lesions Lungs: Clear throughout to auscultation Heart: Regular rate and rhythm; no murmurs, rubs or bruits Abdomen: Soft, non tender and non distended. No masses, hepatosplenomegaly or hernias noted. Normal Bowel sounds Musculoskeletal: Symmetrical with no gross deformities  Pulses:  Normal pulses noted Extremities: No clubbing, cyanosis, edema or deformities noted Neurological: Alert oriented x 4, grossly nonfocal Psychological:  Alert and cooperative. Normal mood and affect  Assessment and Recommendations:  1. IBS.  Continue glycopyrrolate 1 mg po twice daily.  Hyoscyamine 0.125 mg po every 4 hours as needed.  In 1 month taper and discontinue pantoprazole.  Call if symptoms not adequately controlled.  REV in 6 months.

## 2018-10-25 ENCOUNTER — Ambulatory Visit
Admission: EM | Admit: 2018-10-25 | Discharge: 2018-10-25 | Disposition: A | Discharge status: Home / Self Care | Payer: BC Managed Care – PPO | Attending: Family Medicine | Admitting: Family Medicine

## 2018-10-25 ENCOUNTER — Encounter: Payer: Self-pay | Admitting: Emergency Medicine

## 2018-10-25 ENCOUNTER — Other Ambulatory Visit: Payer: Self-pay

## 2018-10-25 DIAGNOSIS — J029 Acute pharyngitis, unspecified: Secondary | ICD-10-CM

## 2018-10-25 LAB — RAPID STREP SCREEN (MED CTR MEBANE ONLY): Streptococcus, Group A Screen (Direct): NEGATIVE

## 2018-10-25 MED ORDER — LIDOCAINE VISCOUS HCL 2 % MT SOLN: OROMUCOSAL | 0 refills | Status: DC

## 2018-10-25 NOTE — ED Triage Notes (Signed)
Patient in today c/o sore throat x 2 days. Patient denies fever. Patient has tried OTC lozenges, Sudafed and Zycam without relief.

## 2018-10-25 NOTE — Discharge Instructions (Signed)
Strep negative.  Use the medication as prescribed.  Ibuprofen as needed.  Take care  Dr. Adriana Simas

## 2018-10-25 NOTE — ED Provider Notes (Signed)
[facility]    CSN: [id number] Arrival date & time: 10/25/18  1458  History   Chief Complaint Chief Complaint  Patient presents with  . Sore Throat    APPT   HPI  27 year old female presents with sore throat.  Started yesterday.  Severe sore throat.  No fever.  No other respiratory symptoms.  She has taken over-the-counter lozenges, used hot tea, and use Sudafed and Zicam without resolution.  No known exacerbating factors.  She is able to eat and drink.  No other associated symptoms.  No other complaints.  PMH, Surgical Hx, Family Hx, Social History reviewed and updated as below.  Past Medical History:  Diagnosis Date  . Allergic rhinitis   . IBS (irritable bowel syndrome)   . Lactose intolerance    symptamatic but not diagnosed  . Rash    Past Surgical History:  Procedure Laterality Date  . MOUTH SURGERY     x 3  . TONSILLECTOMY    . WISDOM TOOTH EXTRACTION      OB History    Gravida  0   Para  0   Term  0   Preterm  0   AB  0   Living  0     SAB  0   TAB  0   Ectopic  0   Multiple  0   Live Births  0            Home Medications    Prior to Admission medications   Medication Sig Start Date End Date Taking? Authorizing Provider  albuterol (PROVENTIL HFA;VENTOLIN HFA) 108 (90 Base) MCG/ACT inhaler Inhale 1-2 puffs into the lungs every 6 (six) hours as needed for wheezing or shortness of breath.   Yes [provider]  glycopyrrolate (ROBINUL) 1 MG tablet Take 1 tablet (1 mg total) by mouth 2 (two) times daily. 03/12/18  Yes Meryl Dare, MD  hyoscyamine (LEVSIN, ANASPAZ) 0.125 MG tablet Take 0.125 mg by mouth daily as needed.    Yes [provider]  levocetirizine (XYZAL) 5 MG tablet Take 5 mg by mouth daily.   Yes [provider]  levonorgestrel-ethinyl estradiol (VIENVA) 0.1-20 MG-MCG tablet Take 1 tablet by mouth daily. 12/30/17  Yes Copland, Helmut Muster B, PA-C  ondansetron (ZOFRAN ODT) 4 MG  disintegrating tablet Take 1 tablet (4 mg total) by mouth every 8 (eight) hours as needed for nausea or vomiting. 03/10/18  Yes Don Perking, [place], MD  pantoprazole (PROTONIX) 40 MG tablet Take 1 tablet (40 mg total) by mouth daily. 03/17/18  Yes Esterwood, Amy S, PA-C  lidocaine (XYLOCAINE) 2 % solution Gargle 15 mL every 3 hours as needed. May swallow if desired. 10/25/18   Tommie Sams, DO    Family History Family History  Problem Relation Age of Onset  . Non-Hodgkin's lymphoma Father   . Colon polyps Father   . Breast cancer Paternal Grandmother 77  . Colon polyps Mother   . Irritable bowel syndrome Mother   . GER disease Brother   . Dementia Maternal Grandmother   . Heart disease Maternal Grandfather   . Diabetes Paternal Grandfather   . Diverticulitis Paternal Grandfather     Social History Social History   Tobacco Use  . Smoking status: Never Smoker  . Smokeless tobacco: Never Used  Substance Use Topics  . Alcohol use: Yes    Comment: social  . Drug use: No    Allergies   Patient has no known allergies.  Review of Systems Review of Systems  Constitutional: Negative for fever.  HENT: Positive for sore throat.    Physical Exam Triage Vital Signs ED Triage Vitals [10/25/18 1508]  Enc Vitals Group     BP 128/85     Pulse Rate 85     Resp 16     Temp 98.9 F (37.2 C)     Temp Source Oral     SpO2 100 %     Weight 140 lb (63.5 kg)     Height 5\' 3"  (1.6 m)     Head Circumference      Peak Flow      Pain Score 7     Pain Loc      Pain Edu?      Excl. in GC?    Updated Vital Signs BP 128/85 (BP Location: Left Arm)   Pulse 85   Temp 98.9 F (37.2 C) (Oral)   Resp 16   Ht 5\' 3"  (1.6 m)   Wt 63.5 kg   LMP 10/16/2018 (Exact Date)   SpO2 100%   BMI 24.80 kg/m   Visual Acuity Right Eye Distance:   Left Eye Distance:   Bilateral Distance:    Right Eye Near:   Left Eye Near:    Bilateral Near:     Physical Exam  Constitutional: She is oriented to  person, place, and time. She appears well-developed. No distress.  HENT:  Head: Normocephalic and atraumatic.  Mouth/Throat: Oropharynx is clear and moist.  Cardiovascular: Normal rate and regular rhythm.  Pulmonary/Chest: Effort normal and breath sounds normal. She has no wheezes. She has no rales.  Neurological: She is alert and oriented to person, place, and time.  Psychiatric: She has a normal mood and affect. Her behavior is normal.  Nursing note and vitals reviewed.  UC Treatments / Results  Labs (all labs ordered are listed, but only abnormal results are displayed) Labs Reviewed  RAPID STREP SCREEN ([facility] ONLY)  CULTURE, GROUP A STREP [facility])    EKG None  Radiology No results found.  Procedures Procedures (including critical care time)  Medications Ordered in UC Medications - No data to display  Initial Impression / Assessment and Plan / UC Course  I have reviewed the triage vital signs and the nursing notes.  Pertinent labs & imaging results that were available during my care of the patient were reviewed by me and considered in my medical decision making (see chart for details).    27 year old female resents with viral pharyngitis.  Strep negative.  Viscous lidocaine as needed.  Supportive care.  Final Clinical Impressions(s) / UC Diagnoses   Final diagnoses:  Viral pharyngitis     Discharge Instructions     Strep negative.  Use the medication as prescribed.  Ibuprofen as needed.  Take care  Dr. Adriana Simas    ED Prescriptions    Medication Sig Dispense Auth. Provider   lidocaine (XYLOCAINE) 2 % solution Gargle 15 mL every 3 hours as needed. May swallow if desired. 200 mL Tommie Sams, DO     Controlled Substance Prescriptions [place] Controlled Substance Registry consulted? Not Applicable   Tommie Sams, DO 10/25/18 1608

## 2018-10-28 LAB — CULTURE, GROUP A STREP (THRC)

## 2018-11-04 ENCOUNTER — Ambulatory Visit: Payer: BC Managed Care – PPO | Admitting: Gastroenterology

## 2018-11-23 ENCOUNTER — Encounter: Payer: Self-pay | Admitting: Gastroenterology

## 2018-11-23 ENCOUNTER — Ambulatory Visit: Payer: BC Managed Care – PPO | Admitting: Gastroenterology

## 2018-11-23 VITALS — BP 120/76 | HR 68 | Ht 63.58 in | Wt 163.1 lb

## 2018-11-23 DIAGNOSIS — K588 Other irritable bowel syndrome: Secondary | ICD-10-CM | POA: Diagnosis not present

## 2018-11-23 DIAGNOSIS — K219 Gastro-esophageal reflux disease without esophagitis: Secondary | ICD-10-CM | POA: Diagnosis not present

## 2018-11-23 MED ORDER — PANTOPRAZOLE SODIUM 40 MG PO TBEC: 40.0000 mg | DELAYED_RELEASE_TABLET | Freq: Every day | ORAL | 11 refills | Status: DC

## 2018-11-23 MED ORDER — HYOSCYAMINE SULFATE 0.125 MG PO TABS: 0.1250 mg | ORAL_TABLET | ORAL | 11 refills | Status: DC | PRN

## 2018-11-23 MED ORDER — GLYCOPYRROLATE 1 MG PO TABS: 1.0000 mg | ORAL_TABLET | Freq: Two times a day (BID) | ORAL | 11 refills | Status: DC

## 2018-11-23 NOTE — Progress Notes (Signed)
    History of Present Illness: This is a 27 year old female returning for follow-up of intermittent abdominal pain and loose stools.  She determined that she has a significant intolerance to carrots which routinely precipitate her symptoms so she has avoided them for the past several months and her symptoms have substantially improved.  She still has mild episodes of abdominal pain and looser stools however she is not certain of any other foods that precipitate her symptoms.  She denies any nausea and vomiting over the past several months.  Current Medications, Allergies, Past Medical History, Past Surgical History, Family History and Social History were reviewed in [facility] electronic medical record.  Physical Exam: General: Well developed, well nourished, no acute distress Head: Normocephalic and atraumatic Eyes:  sclerae anicteric, EOMI Ears: Normal auditory acuity Mouth: No deformity or lesions Lungs: Clear throughout to auscultation Heart: Regular rate and rhythm; no murmurs, rubs or bruits Abdomen: Soft, non tender and non distended. No masses, hepatosplenomegaly or hernias noted. Normal Bowel sounds Rectal: Not done Musculoskeletal: Symmetrical with no gross deformities  Pulses:  Normal pulses noted Extremities: No clubbing, cyanosis, edema or deformities noted Neurological: Alert oriented x 4, grossly nonfocal Psychological:  Alert and cooperative. Normal mood and affect   Assessment and Recommendations:  1.  IBS.  Intolerance to carrots.  She was given a low FODMAP diet to review for other foods that may precipitate symptoms.  Refill glycopyrrolate prn and hyoscyamine prn. REV in 1 year.   2.  GERD.  Intermittent symptoms.  Follow antireflux measures.  Continue pantoprazole 40 mg daily as needed.

## 2018-11-23 NOTE — Patient Instructions (Signed)
We have sent the following medications to your pharmacy for you to pick up at your convenience: pantoprazole, robinul and levsin.   You have been given a Low- fodmap diet to follow.   Thank you for choosing me and [facility].  Venita LickMalcolm T. Pleas KochStark, Jr., MD., Clementeen GrahamFACG

## 2019-01-10 ENCOUNTER — Other Ambulatory Visit: Payer: Self-pay | Admitting: Obstetrics and Gynecology

## 2019-01-10 DIAGNOSIS — Z3041 Encounter for surveillance of contraceptive pills: Secondary | ICD-10-CM

## 2019-01-12 ENCOUNTER — Telehealth: Payer: Self-pay | Admitting: Obstetrics and Gynecology

## 2019-01-12 ENCOUNTER — Other Ambulatory Visit: Payer: Self-pay

## 2019-01-12 DIAGNOSIS — Z3041 Encounter for surveillance of contraceptive pills: Secondary | ICD-10-CM

## 2019-01-12 MED ORDER — LEVONORGESTREL-ETHINYL ESTRAD 0.1-20 MG-MCG PO TABS: 1.0000 | ORAL_TABLET | Freq: Every day | ORAL | 0 refills | Status: DC

## 2019-01-12 NOTE — Telephone Encounter (Signed)
1 RF sent to pharmacy. Pt aware. 

## 2019-01-12 NOTE — Telephone Encounter (Signed)
Patient is schedule 02/01/19 for annual needs refill on birth control. Please advise and notify patient she needs to start new birth control package tomorrow. [facility]

## 2019-02-01 ENCOUNTER — Encounter: Payer: Self-pay | Admitting: Obstetrics and Gynecology

## 2019-02-01 ENCOUNTER — Ambulatory Visit (INDEPENDENT_AMBULATORY_CARE_PROVIDER_SITE_OTHER): Payer: BC Managed Care – PPO | Admitting: Obstetrics and Gynecology

## 2019-02-01 ENCOUNTER — Other Ambulatory Visit (HOSPITAL_COMMUNITY)
Admission: RE | Admit: 2019-02-01 | Discharge: 2019-02-01 | Disposition: A | Discharge status: Home / Self Care | Payer: BC Managed Care – PPO | Source: Ambulatory Visit | Attending: Obstetrics and Gynecology | Admitting: Obstetrics and Gynecology

## 2019-02-01 VITALS — BP 118/70 | HR 70 | Ht 63.0 in | Wt 167.0 lb

## 2019-02-01 DIAGNOSIS — Z01419 Encounter for gynecological examination (general) (routine) without abnormal findings: Secondary | ICD-10-CM

## 2019-02-01 DIAGNOSIS — Z3041 Encounter for surveillance of contraceptive pills: Secondary | ICD-10-CM

## 2019-02-01 DIAGNOSIS — Z124 Encounter for screening for malignant neoplasm of cervix: Secondary | ICD-10-CM | POA: Diagnosis present

## 2019-02-01 MED ORDER — LEVONORGESTREL-ETHINYL ESTRAD 0.1-20 MG-MCG PO TABS: 1.0000 | ORAL_TABLET | Freq: Every day | ORAL | 3 refills | Status: DC

## 2019-02-01 NOTE — Patient Instructions (Signed)
I value your feedback and entrusting us with your care. If you get a [facility] patient survey, I would appreciate you taking the time to let us know about your experience today. Thank you! 

## 2019-02-01 NOTE — Progress Notes (Signed)
PCP:  Janet Perches, MD   Chief Complaint  Patient presents with  . Gynecologic Exam     HPI:      [name] is a 28 y.o. G0P0000 who LMP was Patient's last menstrual period was 01/08/2019 (approximate)., presents today for her annual examination.  Her menses are regular every 28-30 days, lasting 5 days.  Dysmenorrhea none. BTB and menstrual headaches from last yr resolved.   Sex activity: single partner, contraception - OCP (estrogen/progesterone).  Last Pap: 12/30/17  Results were: no abnormalities  Hx of STDs: none  There is a FH of breast cancer in her PGM and pat aunt, genetic testing not indicated for pt, although pt states pat aunt was "gene neg". There is no FH of ovarian cancer. The patient does not do self-breast exams.  Tobacco use: The patient denies current or previous tobacco use. Alcohol use: social drinker No drug use.  Exercise: moderately active  She does get adequate calcium and Vitamin D in her diet.   Past Medical History:  Diagnosis Date  . Allergic rhinitis   . IBS (irritable bowel syndrome)   . Lactose intolerance    symptamatic but not diagnosed  . Rash     Past Surgical History:  Procedure Laterality Date  . MOUTH SURGERY     x 3  . TONSILLECTOMY    . WISDOM TOOTH EXTRACTION      Family History  Problem Relation Age of Onset  . Non-Hodgkin's lymphoma Father   . Colon polyps Father   . Lymphoma Father   . Breast cancer Paternal Grandmother 66  . Colon polyps Mother   . Irritable bowel syndrome Mother   . GER disease Brother   . Dementia Maternal Grandmother   . Heart disease Maternal Grandfather   . Diabetes Paternal Grandfather   . Diverticulitis Paternal Grandfather   . Breast cancer Paternal Aunt 11       "gene neg"    Social History   Socioeconomic History  . Marital status: Single    Spouse name: Not on file  . Number of children: 0  . Years of education: Not on file  . Highest education level: Not on file    Occupational History  . Occupation: Runner, broadcasting/film/video  Social Needs  . Financial resource strain: Not on file  . Food insecurity:    Worry: Not on file    Inability: Not on file  . Transportation needs:    Medical: Not on file    Non-medical: Not on file  Tobacco Use  . Smoking status: Never Smoker  . Smokeless tobacco: Never Used  Substance and Sexual Activity  . Alcohol use: Yes    Comment: social  . Drug use: No  . Sexual activity: Yes    Partners: Male    Birth control/protection: Pill  Lifestyle  . Physical activity:    Days per week: 2 days    Minutes per session: 40 min  . Stress: To some extent  Relationships  . Social connections:    Talks on phone: More than three times a week    Gets together: Once a week    Attends religious service: More than 4 times per year    Active member of club or organization: Yes    Attends meetings of clubs or organizations: More than 4 times per year    Relationship status: Never married  . Intimate partner violence:    Fear of current or ex partner: No  Emotionally abused: No    Physically abused: No    Forced sexual activity: No  Other Topics Concern  . Not on file  Social History Narrative  . Not on file    Current Meds  Medication Sig  . albuterol (PROVENTIL HFA;VENTOLIN HFA) 108 (90 Base) MCG/ACT inhaler Inhale 1-2 puffs into the lungs every 6 (six) hours as needed for wheezing or shortness of breath.  Marland Kitchen glycopyrrolate (ROBINUL) 1 MG tablet Take 1 tablet (1 mg total) by mouth 2 (two) times daily.  . hyoscyamine (LEVSIN, ANASPAZ) 0.125 MG tablet Take 1 tablet (0.125 mg total) by mouth every 4 (four) hours as needed.  Marland Kitchen levocetirizine (XYZAL) 5 MG tablet Take 5 mg by mouth daily.  Marland Kitchen levonorgestrel-ethinyl estradiol (VIENVA) 0.1-20 MG-MCG tablet Take 1 tablet by mouth daily.  . ondansetron (ZOFRAN ODT) 4 MG disintegrating tablet Take 1 tablet (4 mg total) by mouth every 8 (eight) hours as needed for nausea or vomiting.  .  pantoprazole (PROTONIX) 40 MG tablet Take 1 tablet (40 mg total) by mouth daily.  . ranitidine (ZANTAC) 150 MG tablet Take by mouth.  . [DISCONTINUED] levonorgestrel-ethinyl estradiol (VIENVA) 0.1-20 MG-MCG tablet Take 1 tablet by mouth daily.   Current Facility-Administered Medications for the 02/01/19 encounter (Office Visit) with Copland, Ilona Sorrel, PA-C  Medication  . 0.9 %  sodium chloride infusion     ROS:  Review of Systems  Constitutional: Negative for fatigue, fever and unexpected weight change.  Respiratory: Negative for cough, shortness of breath and wheezing.   Cardiovascular: Negative for chest pain, palpitations and leg swelling.  Gastrointestinal: Negative for blood in stool, constipation, diarrhea, nausea and vomiting.  Endocrine: Negative for cold intolerance, heat intolerance and polyuria.  Genitourinary: Negative for dyspareunia, dysuria, flank pain, frequency, genital sores, hematuria, menstrual problem, pelvic pain, urgency, vaginal bleeding, vaginal discharge and vaginal pain.  Musculoskeletal: Negative for back pain, joint swelling and myalgias.  Skin: Negative for rash.  Neurological: Negative for dizziness, syncope, light-headedness, numbness and headaches.  Hematological: Negative for adenopathy.  Psychiatric/Behavioral: Negative for agitation, confusion, sleep disturbance and suicidal ideas. The patient is not nervous/anxious.      Objective: BP 118/70   Pulse 70   Ht 5\' 3"  (1.6 m)   Wt 167 lb (75.8 kg)   LMP 01/08/2019 (Approximate)   BMI 29.58 kg/m    Physical Exam Constitutional:      Appearance: She is well-developed.  Genitourinary:     Vulva, vagina, uterus, right adnexa and left adnexa normal.     No vulval lesion or tenderness noted.     No vaginal discharge, erythema or tenderness.     No cervical motion tenderness or polyp.     Uterus is not enlarged or tender.     No right or left adnexal mass present.     Right adnexa not tender.      Left adnexa not tender.  Neck:     Musculoskeletal: Normal range of motion.     Thyroid: No thyromegaly.  Cardiovascular:     Rate and Rhythm: Normal rate and regular rhythm.     Heart sounds: Normal heart sounds. No murmur.  Pulmonary:     Effort: Pulmonary effort is normal.     Breath sounds: Normal breath sounds.  Chest:     Breasts:        Right: No mass, nipple discharge, skin change or tenderness.        Left: No mass, nipple discharge, skin change  or tenderness.  Abdominal:     Palpations: Abdomen is soft.     Tenderness: There is no abdominal tenderness. There is no guarding.  Musculoskeletal: Normal range of motion.  Neurological:     Mental Status: She is alert and oriented to person, place, and time.     Cranial Nerves: No cranial nerve deficit.  Psychiatric:        Behavior: Behavior normal.  Vitals signs reviewed.     Assessment/Plan: Encounter for annual routine gynecological examination  Cervical cancer screening - Plan: Cytology - PAP  Encounter for surveillance of contraceptive pills - OCP RF - Plan: levonorgestrel-ethinyl estradiol (VIENVA) 0.1-20 MG-MCG tablet  Meds ordered this encounter  Medications  . levonorgestrel-ethinyl estradiol (VIENVA) 0.1-20 MG-MCG tablet    Sig: Take 1 tablet by mouth daily.    Dispense:  84 tablet    Refill:  3    Order Specific Question:   Supervising Provider    Answer:   Nadara MustardHARRIS, ROBERT P [[id number]]             GYN counsel adequate intake of calcium and vitamin D, diet and exercise     F/U  Return in about 1 year (around 02/02/2020).  Alicia B. Copland, PA-C 02/01/2019 4:53 PM

## 2019-02-03 LAB — CYTOLOGY - PAP: Diagnosis: NEGATIVE

## 2019-06-15 IMAGING — US US ABDOMEN COMPLETE
1 series · 14 of 25 positions shown · non-contrast
Comparison: 02/25/2018 CT abdomen and pelvis

CLINICAL DATA: 26 y/o  F; 3 months of abdominal pain.

EXAM:
ABDOMEN ULTRASOUND COMPLETE

[Series 1: us abdomen complete · 0.20mm/px · 14 of 94 slices shown]
[im 1/94]
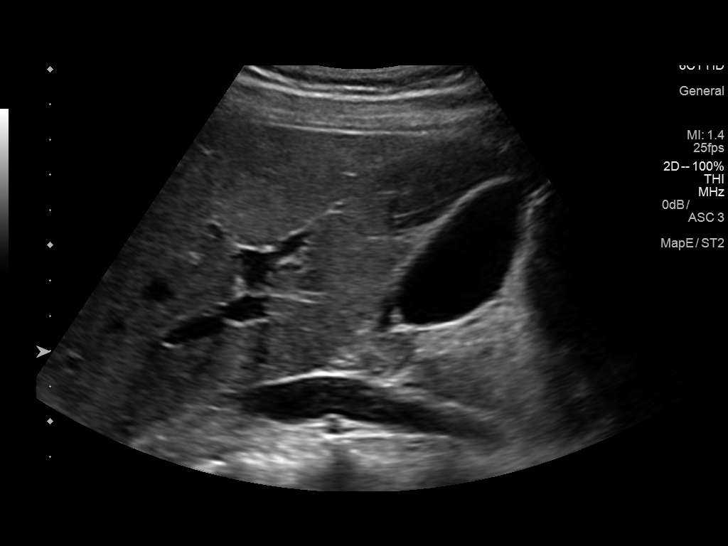
[im 8/94]
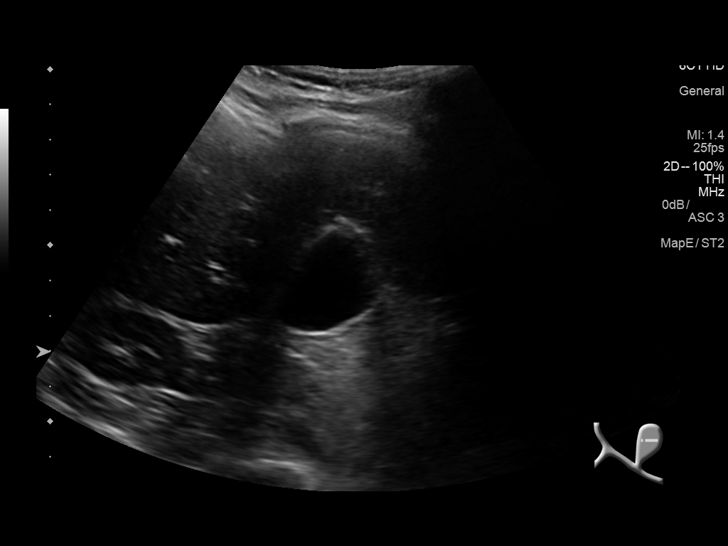
[im 16/94]
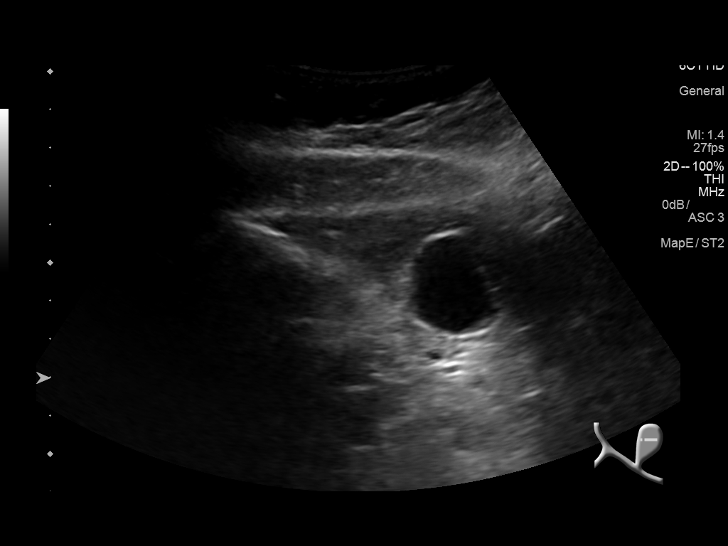
[im 24/94]
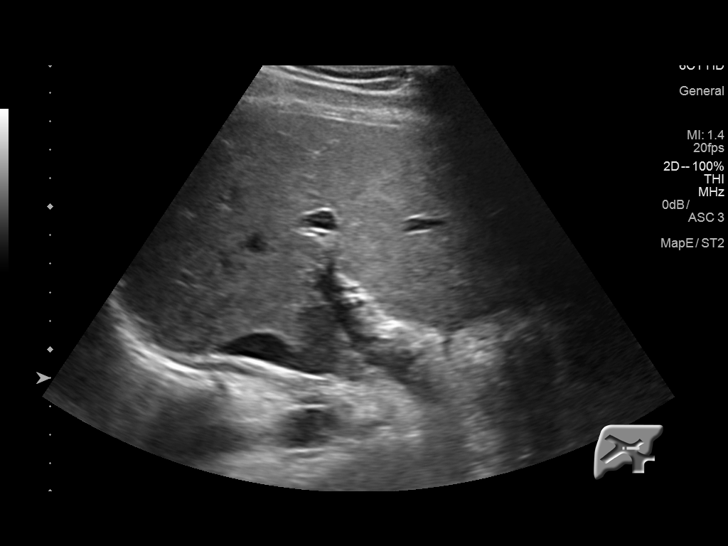
[im 32/94]
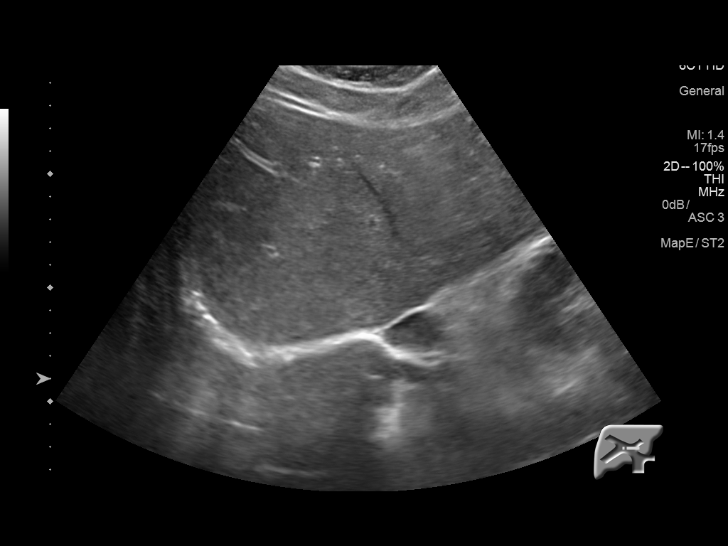
[im 35/94]
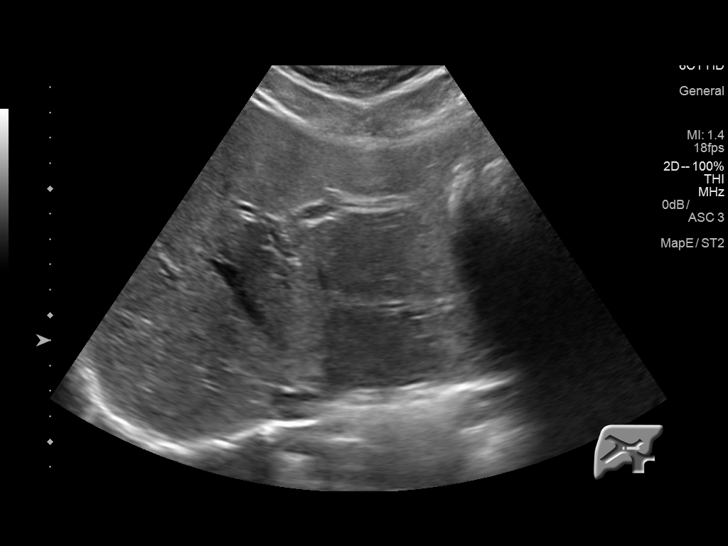
[im 43/94]
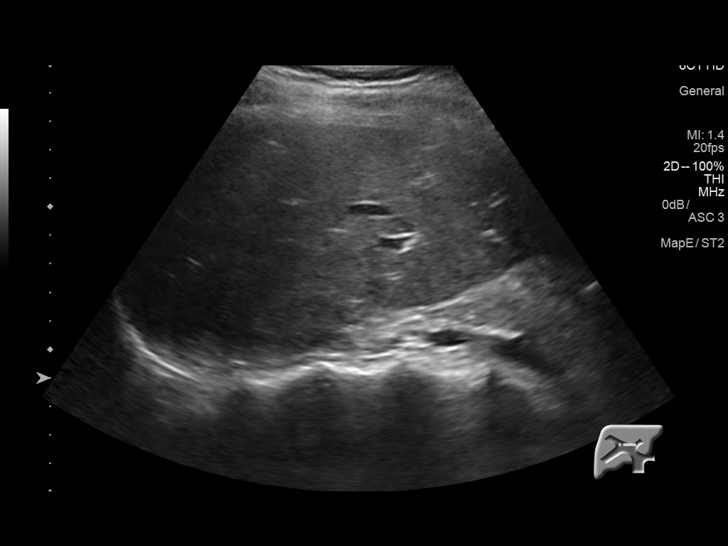
[im 51/94]
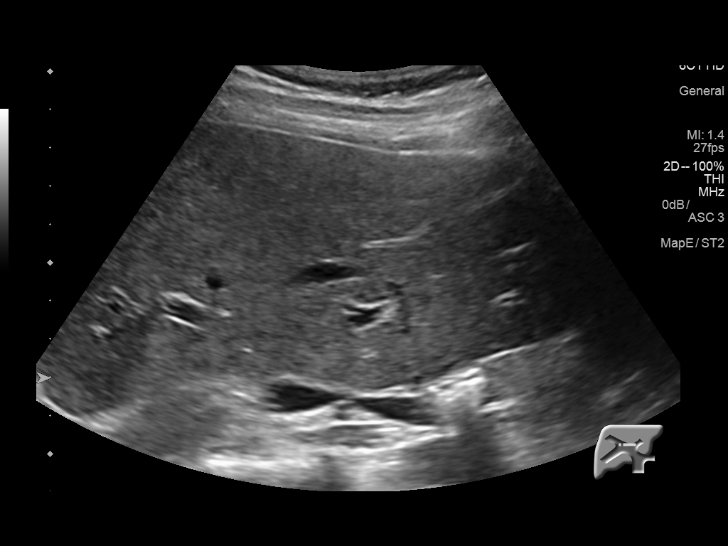
[im 59/94]
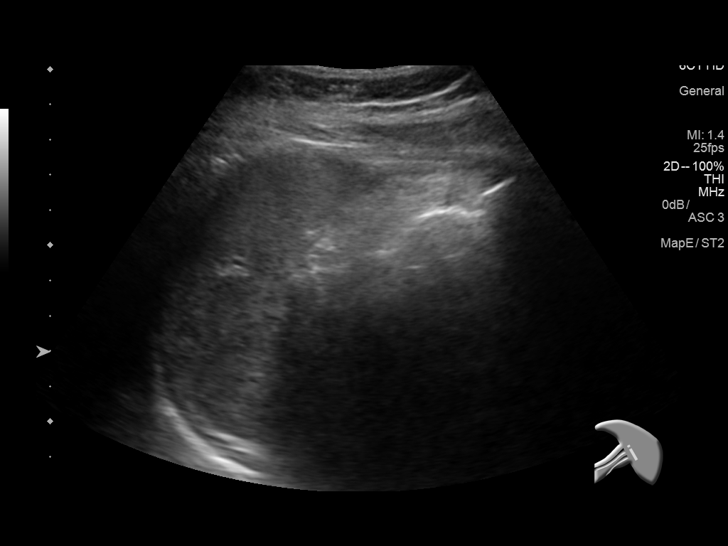
[im 63/94]
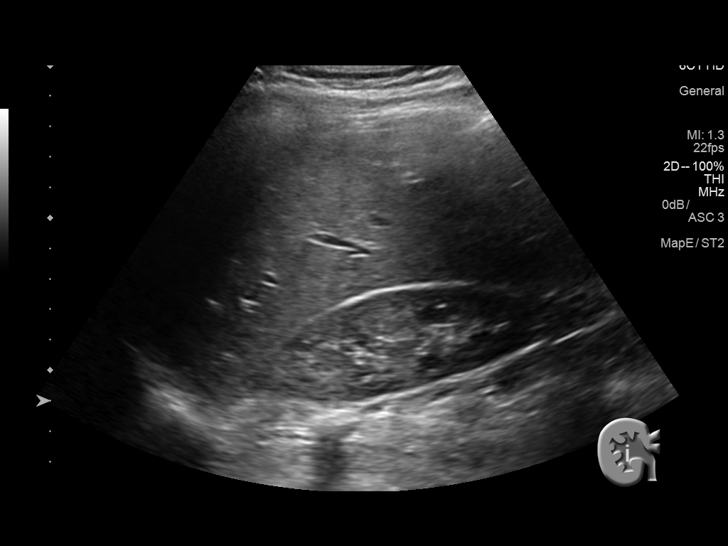
[im 70/94]
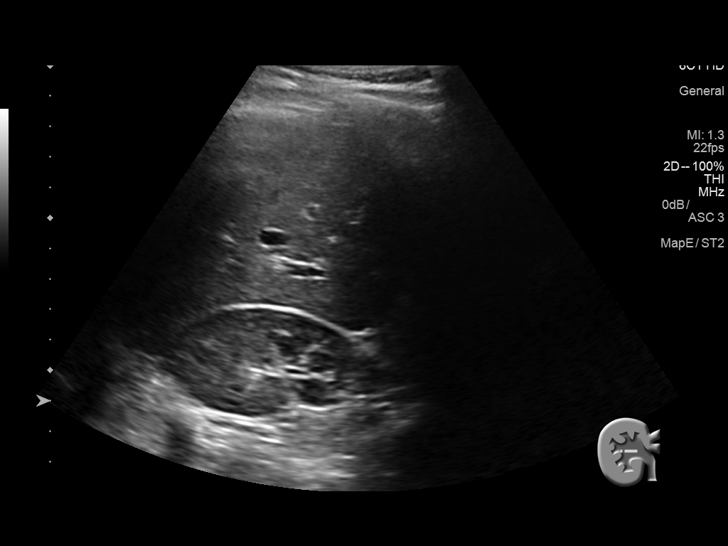
[im 78/94]
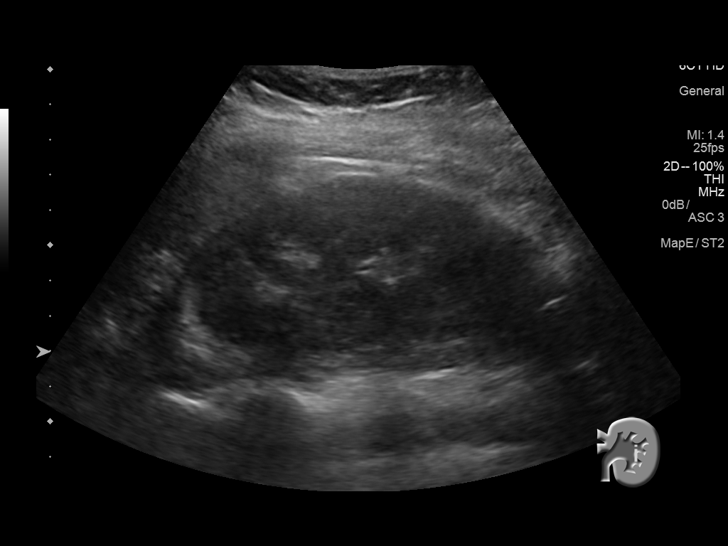
[im 86/94]
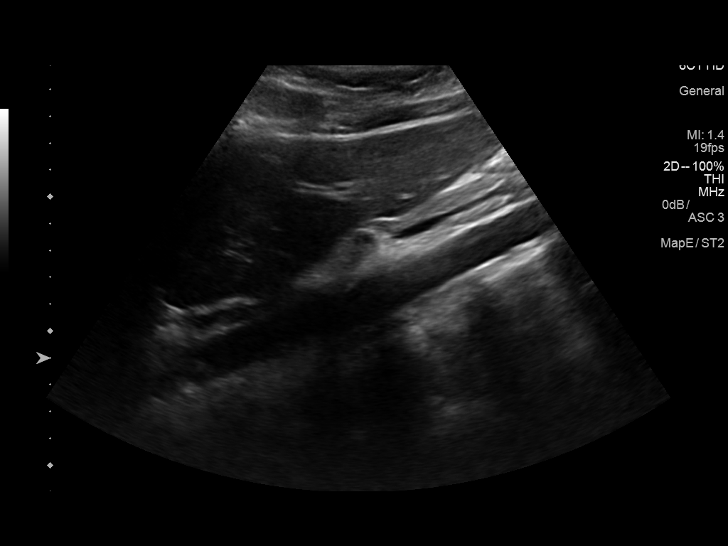
[im 94/94]
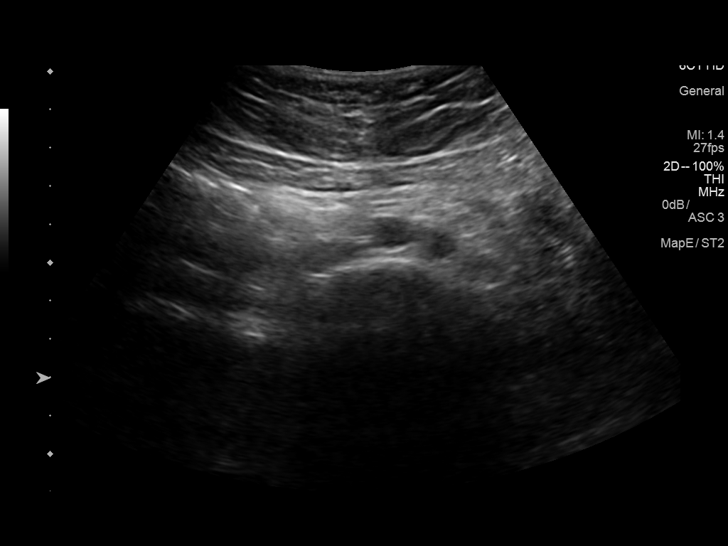

[14 of 25 positions shown; findings below may reference images not displayed]

FINDINGS: Gallbladder: No gallstones or wall thickening visualized. No
sonographic Murphy sign noted by sonographer.

Common bile duct: Diameter: 2.1 mm

Liver: No focal lesion identified. Within normal limits in
parenchymal echogenicity. Portal vein is patent on color Doppler
imaging with normal direction of blood flow towards the liver.

IVC: No abnormality visualized.

Pancreas: Visualized portion unremarkable.

Spleen: Size and appearance within normal limits.

Right Kidney: Length: 10.8 cm. Echogenicity within normal limits. No
mass or hydronephrosis visualized.

Left Kidney: Length: 10.1 cm. Echogenicity within normal limits. No
mass or hydronephrosis visualized.

Abdominal aorta: No aneurysm visualized.

Other findings: None.
IMPRESSION: Normal abdomen ultrasound.

By: Blade Aujla M.D.

## 2019-12-12 ENCOUNTER — Other Ambulatory Visit: Payer: Self-pay

## 2019-12-12 ENCOUNTER — Ambulatory Visit: Payer: BC Managed Care – PPO | Attending: Internal Medicine

## 2019-12-12 DIAGNOSIS — Z20822 Contact with and (suspected) exposure to covid-19: Secondary | ICD-10-CM

## 2019-12-13 LAB — NOVEL CORONAVIRUS, NAA: SARS-CoV-2, NAA: NOT DETECTED

## 2020-01-04 ENCOUNTER — Encounter: Payer: Self-pay | Admitting: Gastroenterology

## 2020-01-04 ENCOUNTER — Ambulatory Visit: Payer: BC Managed Care – PPO | Admitting: Gastroenterology

## 2020-01-04 VITALS — BP 118/70 | HR 76 | Temp 97.9°F | Ht 63.58 in | Wt 163.2 lb

## 2020-01-04 DIAGNOSIS — K588 Other irritable bowel syndrome: Secondary | ICD-10-CM | POA: Diagnosis not present

## 2020-01-04 DIAGNOSIS — K219 Gastro-esophageal reflux disease without esophagitis: Secondary | ICD-10-CM | POA: Diagnosis not present

## 2020-01-04 DIAGNOSIS — R152 Fecal urgency: Secondary | ICD-10-CM | POA: Diagnosis not present

## 2020-01-04 MED ORDER — GLYCOPYRROLATE 1 MG PO TABS: 1.0000 mg | ORAL_TABLET | Freq: Two times a day (BID) | ORAL | 11 refills | Status: DC

## 2020-01-04 NOTE — Progress Notes (Signed)
    History of Present Illness: This is a 29 year old female with IBS.  She has several food intolerances and has improved since avoiding.  She has postprandial urgent diarrhea often following heavier meals in the evening.  Rarely bothersome at light meals.  When taking glycopyrrolate twice a day she had mild constipation so she reduced it to once daily.  Reflux symptoms are well controlled on dietary measures with occasional use of pantoprazole.  Current Medications, Allergies, Past Medical History, Past Surgical History, Family History and Social History were reviewed in [employer] record.   Physical Exam: General: Well developed, well nourished, no acute distress Head: Normocephalic and atraumatic Eyes:  sclerae anicteric, EOMI Ears: Normal auditory acuity Mouth: No deformity or lesions Lungs: Clear throughout to auscultation Heart: Regular rate and rhythm; no murmurs, rubs or bruits Abdomen: Soft, non tender and non distended. No masses, hepatosplenomegaly or hernias noted. Normal Bowel sounds Rectal: Not done Musculoskeletal: Symmetrical with no gross deformities  Pulses:  Normal pulses noted Extremities: No clubbing, cyanosis, edema or deformities noted Neurological: Alert oriented x 4, grossly nonfocal Psychological:  Alert and cooperative. Normal mood and affect   Assessment and Recommendations:  1.  IBS. Frequent defecation urgency and diarrhea are currently main problems.   Intolerance to carrots, lactose products and several other foods.  Continue glycopyrrolate 1 mg p.o. daily.  Hyoscyamine 0.125 mg 1-2 po before evening meal and every 4 hours as needed. REV in 1 year.   2.  GERD.  EGD in March 2019 was normal.  Follow antireflux measures.  Pantoprazole 40 mg daily as needed.

## 2020-01-04 NOTE — Patient Instructions (Addendum)
Take your Levsin before meals as needed.   We have sent the following medications to your pharmacy for you to pick up at your convenience:glycopyrrolate.   Normal BMI (Body Mass Index- based on height and weight) is between 19 and 25. Your BMI today is Body mass index is 28.39 kg/m. Marland Kitchen Please consider follow up  regarding your BMI with your Primary Care Provider.   Thank you for choosing me and [facility].  Venita Lick. Pleas Koch., MD., Clementeen Graham

## 2020-01-07 ENCOUNTER — Other Ambulatory Visit: Payer: Self-pay | Admitting: Obstetrics and Gynecology

## 2020-01-07 DIAGNOSIS — Z3041 Encounter for surveillance of contraceptive pills: Secondary | ICD-10-CM

## 2020-02-06 ENCOUNTER — Encounter: Payer: Self-pay | Admitting: Obstetrics and Gynecology

## 2020-02-06 ENCOUNTER — Other Ambulatory Visit: Payer: Self-pay

## 2020-02-06 ENCOUNTER — Ambulatory Visit (INDEPENDENT_AMBULATORY_CARE_PROVIDER_SITE_OTHER): Payer: BC Managed Care – PPO | Admitting: Obstetrics and Gynecology

## 2020-02-06 VITALS — BP 120/70 | Ht 63.0 in | Wt 170.0 lb

## 2020-02-06 DIAGNOSIS — Z3041 Encounter for surveillance of contraceptive pills: Secondary | ICD-10-CM

## 2020-02-06 DIAGNOSIS — Z01419 Encounter for gynecological examination (general) (routine) without abnormal findings: Secondary | ICD-10-CM

## 2020-02-06 MED ORDER — LEVONORGESTREL-ETHINYL ESTRAD 0.1-20 MG-MCG PO TABS: 1.0000 | ORAL_TABLET | Freq: Every day | ORAL | 3 refills | Status: DC

## 2020-02-06 NOTE — Progress Notes (Signed)
PCP:  Asencion Noble, MD   Chief Complaint  Patient presents with  . Gynecologic Exam     HPI:      [name] is a 29 y.o. G0P0000 who LMP was Patient's last menstrual period was 02/04/2020 (exact date)., presents today for her annual examination.  Her menses are regular every 28-30 days, lasting 4-5 days.  Dysmenorrhea none, no BTB.  Sex activity: single partner, contraception - OCP (estrogen/progesterone).  Last Pap: 02/01/19  Results were: no abnormalities  Hx of STDs: none  There is a FH of breast cancer in her PGM and pat aunt, genetic testing not indicated for pt, although pt states pat aunt was "gene neg" 2 yrs ago. There is no FH of ovarian cancer. The patient does not do self-breast exams.  Tobacco use: The patient denies current or previous tobacco use. Alcohol use: social drinker No drug use.  Exercise: very active  She does get adequate calcium but not Vitamin D in her diet.   Past Medical History:  Diagnosis Date  . Allergic rhinitis   . IBS (irritable bowel syndrome)   . Lactose intolerance    symptamatic but not diagnosed  . Rash     Past Surgical History:  Procedure Laterality Date  . MOUTH SURGERY     x 3  . TONSILLECTOMY    . WISDOM TOOTH EXTRACTION      Family History  Problem Relation Age of Onset  . Non-Hodgkin's lymphoma Father   . Colon polyps Father   . Lymphoma Father   . Breast cancer Paternal Grandmother 71  . Colon polyps Mother   . Irritable bowel syndrome Mother   . GER disease Brother   . Dementia Maternal Grandmother   . Heart disease Maternal Grandfather   . Diabetes Paternal Grandfather   . Diverticulitis Paternal Grandfather   . Breast cancer Paternal Aunt 32       "gene neg"    Social History   Socioeconomic History  . Marital status: Single    Spouse name: Not on file  . Number of children: 0  . Years of education: Not on file  . Highest education level: Not on file  Occupational History  . Occupation:  Pharmacist, hospital  Tobacco Use  . Smoking status: Never Smoker  . Smokeless tobacco: Never Used  Substance and Sexual Activity  . Alcohol use: Yes    Comment: social  . Drug use: No  . Sexual activity: Yes    Partners: Male    Birth control/protection: Pill  Other Topics Concern  . Not on file  Social History Narrative  . Not on file   Social Determinants of Health   Financial Resource Strain:   . Difficulty of Paying Living Expenses: Not on file  Food Insecurity:   . Worried About Charity fundraiser in the Last Year: Not on file  . Ran Out of Food in the Last Year: Not on file  Transportation Needs:   . Lack of Transportation (Medical): Not on file  . Lack of Transportation (Non-Medical): Not on file  Physical Activity:   . Days of Exercise per Week: Not on file  . Minutes of Exercise per Session: Not on file  Stress:   . Feeling of Stress : Not on file  Social Connections:   . Frequency of Communication with Friends and Family: Not on file  . Frequency of Social Gatherings with Friends and Family: Not on file  . Attends Religious  Services: Not on file  . Active Member of Clubs or Organizations: Not on file  . Attends Banker Meetings: Not on file  . Marital Status: Not on file  Intimate Partner Violence:   . Fear of Current or Ex-Partner: Not on file  . Emotionally Abused: Not on file  . Physically Abused: Not on file  . Sexually Abused: Not on file    Current Meds  Medication Sig  . albuterol (PROVENTIL HFA;VENTOLIN HFA) 108 (90 Base) MCG/ACT inhaler Inhale 1-2 puffs into the lungs every 6 (six) hours as needed for wheezing or shortness of breath.  Marland Kitchen glycopyrrolate (ROBINUL) 1 MG tablet Take 1 tablet (1 mg total) by mouth 2 (two) times daily.  . hyoscyamine (LEVSIN, ANASPAZ) 0.125 MG tablet Take 1 tablet (0.125 mg total) by mouth every 4 (four) hours as needed.  Marland Kitchen levocetirizine (XYZAL) 5 MG tablet Take 5 mg by mouth daily.  Marland Kitchen levonorgestrel-ethinyl estradiol  (LESSINA-28) 0.1-20 MG-MCG tablet Take 1 tablet by mouth daily.  . pantoprazole (PROTONIX) 40 MG tablet Take 1 tablet (40 mg total) by mouth daily. (Patient taking differently: Take 40 mg by mouth as needed. )  . [DISCONTINUED] LESSINA-28 0.1-20 MG-MCG tablet TAKE 1 TABLET BY MOUTH DAILY   Current Facility-Administered Medications for the 02/06/20 encounter (Office Visit) with Ahmya Bernick, Ilona Sorrel, PA-C  Medication  . 0.9 %  sodium chloride infusion     ROS:  Review of Systems  Constitutional: Negative for fatigue, fever and unexpected weight change.  Respiratory: Negative for cough, shortness of breath and wheezing.   Cardiovascular: Negative for chest pain, palpitations and leg swelling.  Gastrointestinal: Negative for blood in stool, constipation, diarrhea, nausea and vomiting.  Endocrine: Negative for cold intolerance, heat intolerance and polyuria.  Genitourinary: Negative for dyspareunia, dysuria, flank pain, frequency, genital sores, hematuria, menstrual problem, pelvic pain, urgency, vaginal bleeding, vaginal discharge and vaginal pain.  Musculoskeletal: Negative for back pain, joint swelling and myalgias.  Skin: Negative for rash.  Neurological: Negative for dizziness, syncope, light-headedness, numbness and headaches.  Hematological: Negative for adenopathy.  Psychiatric/Behavioral: Negative for agitation, confusion, sleep disturbance and suicidal ideas. The patient is not nervous/anxious.      Objective: BP 120/70   Ht 5\' 3"  (1.6 m)   Wt 170 lb (77.1 kg)   LMP 02/04/2020 (Exact Date)   BMI 30.11 kg/m    Physical Exam Constitutional:      Appearance: She is well-developed.  Genitourinary:     Vulva, vagina, uterus, right adnexa and left adnexa normal.     No vulval lesion or tenderness noted.     No vaginal discharge, erythema or tenderness.     No cervical motion tenderness or polyp.     Uterus is not enlarged or tender.     No right or left adnexal mass present.      Right adnexa not tender.     Left adnexa not tender.  Neck:     Thyroid: No thyromegaly.  Cardiovascular:     Rate and Rhythm: Normal rate and regular rhythm.     Heart sounds: Normal heart sounds. No murmur.  Pulmonary:     Effort: Pulmonary effort is normal.     Breath sounds: Normal breath sounds.  Chest:     Breasts:        Right: No mass, nipple discharge, skin change or tenderness.        Left: No mass, nipple discharge, skin change or tenderness.  Abdominal:  Palpations: Abdomen is soft.     Tenderness: There is no abdominal tenderness. There is no guarding.  Musculoskeletal:        General: Normal range of motion.     Cervical back: Normal range of motion.  Neurological:     General: No focal deficit present.     Mental Status: She is alert and oriented to person, place, and time.     Cranial Nerves: No cranial nerve deficit.  Skin:    General: Skin is warm and dry.  Psychiatric:        Mood and Affect: Mood normal.        Behavior: Behavior normal.        Thought Content: Thought content normal.        Judgment: Judgment normal.  Vitals reviewed.     Assessment/Plan: Encounter for annual routine gynecological examination  Encounter for surveillance of contraceptive pills - Plan: levonorgestrel-ethinyl estradiol (LESSINA-28) 0.1-20 MG-MCG tablet; OCP RF. Menses due at wedding. Will call 7/21 to adjust pills and period.   Meds ordered this encounter  Medications  . levonorgestrel-ethinyl estradiol (LESSINA-28) 0.1-20 MG-MCG tablet    Sig: Take 1 tablet by mouth daily.    Dispense:  84 tablet    Refill:  3    Order Specific Question:   Supervising Provider    Answer:   Nadara Mustard [[id number]]             GYN counsel adequate intake of calcium and vitamin D, diet and exercise     F/U  Return in about 1 year (around 02/05/2021).  Gerrett Loman B. Arleen Bar, PA-C 02/06/2020 4:55 PM

## 2020-02-06 NOTE — Patient Instructions (Signed)
I value your feedback and entrusting us with your care. If you get a [facility] patient survey, I would appreciate you taking the time to let us know about your experience today. Thank you!  As of December 01, 2019, your lab results will be released to your MyChart immediately, before I even have a chance to see them. Please give me time to review them and contact you if there are any abnormalities. Thank you for your patience.  

## 2020-06-01 ENCOUNTER — Encounter: Payer: Self-pay | Admitting: Obstetrics and Gynecology

## 2021-01-07 ENCOUNTER — Other Ambulatory Visit: Payer: Self-pay | Admitting: Gastroenterology

## 2021-01-08 ENCOUNTER — Other Ambulatory Visit: Payer: Self-pay

## 2021-01-08 MED ORDER — GLYCOPYRROLATE 1 MG PO TABS: ORAL_TABLET | ORAL | 1 refills | Status: DC

## 2021-02-12 ENCOUNTER — Encounter: Payer: Self-pay | Admitting: Gastroenterology

## 2021-02-12 ENCOUNTER — Ambulatory Visit: Payer: BC Managed Care – PPO | Admitting: Gastroenterology

## 2021-02-12 ENCOUNTER — Ambulatory Visit (INDEPENDENT_AMBULATORY_CARE_PROVIDER_SITE_OTHER): Payer: BC Managed Care – PPO | Admitting: Obstetrics and Gynecology

## 2021-02-12 ENCOUNTER — Other Ambulatory Visit: Payer: Self-pay

## 2021-02-12 ENCOUNTER — Encounter: Payer: Self-pay | Admitting: Obstetrics and Gynecology

## 2021-02-12 VITALS — BP 114/70 | HR 72 | Ht 63.0 in | Wt 160.0 lb

## 2021-02-12 VITALS — BP 90/60 | Ht 63.0 in | Wt 161.0 lb

## 2021-02-12 DIAGNOSIS — K580 Irritable bowel syndrome with diarrhea: Secondary | ICD-10-CM

## 2021-02-12 DIAGNOSIS — Z3041 Encounter for surveillance of contraceptive pills: Secondary | ICD-10-CM | POA: Diagnosis not present

## 2021-02-12 DIAGNOSIS — Z01419 Encounter for gynecological examination (general) (routine) without abnormal findings: Secondary | ICD-10-CM | POA: Diagnosis not present

## 2021-02-12 MED ORDER — PANTOPRAZOLE SODIUM 40 MG PO TBEC: 40.0000 mg | DELAYED_RELEASE_TABLET | Freq: Every day | ORAL | 11 refills | Status: DC

## 2021-02-12 MED ORDER — HYOSCYAMINE SULFATE 0.125 MG PO TABS: 0.1250 mg | ORAL_TABLET | ORAL | 11 refills | Status: DC | PRN

## 2021-02-12 MED ORDER — LEVONORGESTREL-ETHINYL ESTRAD 0.1-20 MG-MCG PO TABS
1.0000 | ORAL_TABLET | Freq: Every day | ORAL | 3 refills | Status: DC
Start: 2021-02-12 — End: 2022-01-14

## 2021-02-12 MED ORDER — GLYCOPYRROLATE 1 MG PO TABS: ORAL_TABLET | ORAL | 11 refills | Status: DC

## 2021-02-12 NOTE — Progress Notes (Signed)
PCP:  Carylon Perches, MD   Chief Complaint  Patient presents with  . Gynecologic Exam    No concerns     HPI:      [name] is a 30 y.o. G0P0000 who LMP was Patient's last menstrual period was 01/19/2021 (approximate)., presents today for her annual examination.  Her menses are regular every 28-30 days, lasting 4-5 days.  Dysmenorrhea rarely, no BTB.  Sex activity: single partner, contraception - OCP (estrogen/progesterone). May want to conceive by next yr. Had monthly menses prior to OCPs. Last Pap: 02/01/19  Results were: no abnormalities  Hx of STDs: none  There is a FH of breast cancer in her PGM and pat aunt, genetic testing not indicated for pt, although pt states pat aunt was "gene neg" 2 yrs ago. There is no FH of ovarian cancer. The patient does not do self-breast exams.  Tobacco use: The patient denies current or previous tobacco use. Alcohol use: social drinker No drug use.  Exercise: very active  She does get adequate calcium and Vitamin D in her diet.   Past Medical History:  Diagnosis Date  . Allergic rhinitis   . IBS (irritable bowel syndrome)   . Lactose intolerance    symptamatic but not diagnosed  . Rash     Past Surgical History:  Procedure Laterality Date  . MOUTH SURGERY     x 3  . TONSILLECTOMY    . WISDOM TOOTH EXTRACTION      Family History  Problem Relation Age of Onset  . Non-Hodgkin's lymphoma Father   . Colon polyps Father   . Lymphoma Father   . Breast cancer Paternal Grandmother 45  . Colon polyps Mother   . Irritable bowel syndrome Mother   . GER disease Brother   . Dementia Maternal Grandmother   . Heart disease Maternal Grandfather   . Diabetes Paternal Grandfather   . Diverticulitis Paternal Grandfather   . Breast cancer Paternal Aunt 59       "gene neg"    Social History   Socioeconomic History  . Marital status: Single    Spouse name: Not on file  . Number of children: 0  . Years of education: Not on  file  . Highest education level: Not on file  Occupational History  . Occupation: Runner, broadcasting/film/video  Tobacco Use  . Smoking status: Never Smoker  . Smokeless tobacco: Never Used  Vaping Use  . Vaping Use: Never used  Substance and Sexual Activity  . Alcohol use: Yes    Comment: social  . Drug use: No  . Sexual activity: Yes    Partners: Male    Birth control/protection: Pill  Other Topics Concern  . Not on file  Social History Narrative  . Not on file   Social Determinants of Health   Financial Resource Strain: Not on file  Food Insecurity: Not on file  Transportation Needs: Not on file  Physical Activity: Not on file  Stress: Not on file  Social Connections: Not on file  Intimate Partner Violence: Not on file    Current Meds  Medication Sig  . glycopyrrolate (ROBINUL) 1 MG tablet TAKE 1 TABLET(1 MG) BY MOUTH TWICE DAILY  . hyoscyamine (LEVSIN) 0.125 MG tablet Take 1 tablet (0.125 mg total) by mouth every 4 (four) hours as needed.  Marland Kitchen levocetirizine (XYZAL) 5 MG tablet Take 5 mg by mouth daily.  . pantoprazole (PROTONIX) 40 MG tablet Take 1 tablet (40 mg total) by  mouth daily.  . [DISCONTINUED] levonorgestrel-ethinyl estradiol (LESSINA-28) 0.1-20 MG-MCG tablet Take 1 tablet by mouth daily.     ROS:  Review of Systems  Constitutional: Negative for fatigue, fever and unexpected weight change.  Respiratory: Negative for cough, shortness of breath and wheezing.   Cardiovascular: Negative for chest pain, palpitations and leg swelling.  Gastrointestinal: Negative for blood in stool, constipation, diarrhea, nausea and vomiting.  Endocrine: Negative for cold intolerance, heat intolerance and polyuria.  Genitourinary: Negative for dyspareunia, dysuria, flank pain, frequency, genital sores, hematuria, menstrual problem, pelvic pain, urgency, vaginal bleeding, vaginal discharge and vaginal pain.  Musculoskeletal: Negative for back pain, joint swelling and myalgias.  Skin: Negative for  rash.  Neurological: Negative for dizziness, syncope, light-headedness, numbness and headaches.  Hematological: Negative for adenopathy.  Psychiatric/Behavioral: Negative for agitation, confusion, sleep disturbance and suicidal ideas. The patient is not nervous/anxious.      Objective: BP 90/60   Ht 5\' 3"  (1.6 m)   Wt 161 lb (73 kg)   LMP 01/19/2021 (Approximate)   BMI 28.52 kg/m    Physical Exam Constitutional:      Appearance: She is well-developed.  Genitourinary:     Vulva normal.     Right Labia: No rash, tenderness or lesions.    Left Labia: No tenderness, lesions or rash.    No vaginal discharge, erythema or tenderness.      Right Adnexa: not tender and no mass present.    Left Adnexa: not tender and no mass present.    No cervical motion tenderness, friability or polyp.     Uterus is not enlarged or tender.  Breasts:     Right: No mass, nipple discharge, skin change or tenderness.     Left: No mass, nipple discharge, skin change or tenderness.    Neck:     Thyroid: No thyromegaly.  Cardiovascular:     Rate and Rhythm: Normal rate and regular rhythm.     Heart sounds: Normal heart sounds. No murmur heard.   Pulmonary:     Effort: Pulmonary effort is normal.     Breath sounds: Normal breath sounds.  Abdominal:     Palpations: Abdomen is soft.     Tenderness: There is no abdominal tenderness. There is no guarding or rebound.  Musculoskeletal:        General: Normal range of motion.     Cervical back: Normal range of motion.  Lymphadenopathy:     Cervical: No cervical adenopathy.  Neurological:     General: No focal deficit present.     Mental Status: She is alert and oriented to person, place, and time.     Cranial Nerves: No cranial nerve deficit.  Skin:    General: Skin is warm and dry.  Psychiatric:        Mood and Affect: Mood normal.        Behavior: Behavior normal.        Thought Content: Thought content normal.        Judgment: Judgment  normal.  Vitals reviewed.     Assessment/Plan: Encounter for annual routine gynecological examination  Encounter for surveillance of contraceptive pills - Plan: levonorgestrel-ethinyl estradiol (LESSINA-28) 0.1-20 MG-MCG tablet; OCP RF.   Pre-conceptual counseling--start PNVs, d/c OCPs. RTO prn NOB.   Meds ordered this encounter  Medications  . levonorgestrel-ethinyl estradiol (LESSINA-28) 0.1-20 MG-MCG tablet    Sig: Take 1 tablet by mouth daily.    Dispense:  84 tablet    Refill:  3  Order Specific Question:   Supervising Provider    Answer:   Nadara Mustard [[id number]]             GYN counsel adequate intake of calcium and vitamin D, diet and exercise     F/U  Return in about 1 year (around 02/12/2022).  Kadarious Dikes B. Yazmyn Valbuena, PA-C 02/12/2021 4:41 PM

## 2021-02-12 NOTE — Patient Instructions (Signed)
We have sent the following medications to your pharmacy for you to pick up at your convenience: pantoprazole, glycopyrrolate and levisn.   Thank you for choosing me and  Gastroenterology.  Venita Lick. Pleas Koch., MD., Clementeen Graham

## 2021-02-12 NOTE — Progress Notes (Signed)
    History of Present Illness: This is a 30 year old female returning for follow-up of IBS-D and GERD.  She states her reflux symptoms are intermittent and generally driven by certain foods such as pizza.  She takes pantoprazole daily when needed.  Her IBS-D is frequently active.  Symptoms controlled with a combination of glycopyrrolate twice daily, hyoscyamine as needed and Imodium as needed. Denies weight loss, constipation, change in stool caliber, melena, hematochezia, nausea, vomiting, dysphagia, chest pain.  Current Medications, Allergies, Past Medical History, Past Surgical History, Family History and Social History were reviewed in [employer] record.   Physical Exam: General: Well developed, well nourished, no acute distress Head: Normocephalic and atraumatic Eyes: Sclerae anicteric, EOMI Ears: Normal auditory acuity Mouth: Not examined, mask on during Covid-19 pandemic Lungs: Clear throughout to auscultation Heart: Regular rate and rhythm; no murmurs, rubs or bruits Abdomen: Soft, non tender and non distended. No masses, hepatosplenomegaly or hernias noted. Normal Bowel sounds Rectal: Not done  Musculoskeletal: Symmetrical with no gross deformities  Pulses:  Normal pulses noted Extremities: No clubbing, cyanosis, edema or deformities noted Neurological: Alert oriented x 4, grossly nonfocal Psychological:  Alert and cooperative. Normal mood and affect   Assessment and Recommendations:  1.  GERD.  Follow antireflux measures.  Continue pantoprazole 40 mg daily as needed.  2.  IBS-D.  Continue glycopyrrolate 1 mg twice daily, hyoscyamine 0.125mg  1-2 every 4 hours as needed and Imodium twice daily as needed. REV in 1 year.

## 2021-02-12 NOTE — Patient Instructions (Signed)
I value your feedback and you entrusting us with your care. If you get a [facility] patient survey, I would appreciate you taking the time to let us know about your experience today. Thank you! ? ? ?

## 2021-06-27 ENCOUNTER — Encounter: Payer: Self-pay | Admitting: Obstetrics and Gynecology

## 2021-12-22 NOTE — L&D Delivery Note (Signed)
       Delivery Note   Janet Gilmore is a 31 y.o. G1P0000 at [redacted]w[redacted]d Estimated Date of Delivery: 08/31/22  PRE-OPERATIVE DIAGNOSIS:  1) [redacted]w[redacted]d pregnancy.  2) poor maternal expulsive effort  POST-OPERATIVE DIAGNOSIS:  1) [redacted]w[redacted]d pregnancy s/p Vaginal, Vacuum (Extractor)  2)   Delivery Type: Vaginal, Vacuum (Extractor)    Delivery Anesthesia: Epidural;Local   Labor Complications:  Poor maternal expulsive effort    ESTIMATED BLOOD LOSS: 200 ml    FINDINGS:   1) female infant, Apgar scores of    at 1 minute and    at 5 minutes and a birthweight of   ounces.    2) Nuchal cord:   SPECIMENS:   PLACENTA:   Appearance: Intact    Removal: Spontaneous      Disposition:    DISPOSITION:  Infant to left in stable condition in the delivery room, with L&D personnel and mother,  NARRATIVE SUMMARY: Labor course:  [name] is a G1P0000 at [redacted]w[redacted]d who presented for labor management.  She progressed well in labor with pitocin.  She received the appropriate anesthesia and proceeded to complete dilation. She evidenced poor maternal expulsive effort during the second stage. With the head on the perineum a vacuum delivery was indicated.  R/B discussed. After the vacuum was applied, traction was employed over several contractions.  Little decent was noted.Marland KitchenHowever, the baby showed decent when mom began to feel perineal pressure.  A midline episiotomy was performed.  The fetal vertex was delivered in a controlled manner.  The infant was delivered through a nuchal cord and was placed on the mothers abdomen where he was attended to by the transition team. The placenta delivered without problems and was noted to be complete. A perineal and vaginal examination was performed. Episiotomy/Lacerations: 3rd degree  (partial) Episiotomy or lacerations were repaired with Vicryl suture using local anesthesia. The patient tolerated this well.  Elonda Husky, M.D. 09/02/2022 5:35 PM

## 2022-01-12 ENCOUNTER — Encounter: Payer: Self-pay | Admitting: Advanced Practice Midwife

## 2022-01-14 ENCOUNTER — Ambulatory Visit (INDEPENDENT_AMBULATORY_CARE_PROVIDER_SITE_OTHER): Payer: BC Managed Care – PPO | Admitting: Advanced Practice Midwife

## 2022-01-14 ENCOUNTER — Encounter: Payer: Self-pay | Admitting: Advanced Practice Midwife

## 2022-01-14 ENCOUNTER — Other Ambulatory Visit (HOSPITAL_COMMUNITY)
Admission: RE | Admit: 2022-01-14 | Discharge: 2022-01-14 | Disposition: A | Discharge status: Home / Self Care | Payer: BC Managed Care – PPO | Source: Ambulatory Visit | Attending: Advanced Practice Midwife | Admitting: Advanced Practice Midwife

## 2022-01-14 ENCOUNTER — Other Ambulatory Visit: Payer: Self-pay

## 2022-01-14 VITALS — BP 100/60 | Wt 161.0 lb

## 2022-01-14 DIAGNOSIS — Z369 Encounter for antenatal screening, unspecified: Secondary | ICD-10-CM | POA: Insufficient documentation

## 2022-01-14 DIAGNOSIS — Z113 Encounter for screening for infections with a predominantly sexual mode of transmission: Secondary | ICD-10-CM | POA: Insufficient documentation

## 2022-01-14 DIAGNOSIS — Z3401 Encounter for supervision of normal first pregnancy, first trimester: Secondary | ICD-10-CM | POA: Insufficient documentation

## 2022-01-14 DIAGNOSIS — Z124 Encounter for screening for malignant neoplasm of cervix: Secondary | ICD-10-CM | POA: Insufficient documentation

## 2022-01-14 DIAGNOSIS — Z3490 Encounter for supervision of normal pregnancy, unspecified, unspecified trimester: Secondary | ICD-10-CM | POA: Insufficient documentation

## 2022-01-14 DIAGNOSIS — Z1159 Encounter for screening for other viral diseases: Secondary | ICD-10-CM

## 2022-01-14 LAB — POCT URINE PREGNANCY: Preg Test, Ur: POSITIVE — AB

## 2022-01-14 NOTE — Patient Instructions (Signed)

## 2022-01-14 NOTE — Progress Notes (Signed)
New Obstetric Patient H&P  Date of Service: 01/14/2022  Chief Complaint: "Desires prenatal care"   History of Present Illness: Patient is a 31 y.o. G1P0000 Not Hispanic or Latino female, presents with amenorrhea and positive home pregnancy test. Patient's last menstrual period was 11/24/2021 (exact date). and based on her  LMP, her EDD is Estimated Date of Delivery: 08/31/22 and her EGA is 109w2d. Cycles are 5 days, regular, and occur approximately every : 28 days. Her last pap smear was 3 years ago and was no abnormalities.    She had a urine pregnancy test which was positive 3 week(s)  ago. Her last menstrual period was normal and lasted for  5 day(s). Since her LMP she claims she has experienced breast tenderness, fatigue, nausea, a couple episodes of brown spotting. Her past medical history is contributory for IBS-D.   Since her LMP, she admits to the use of tobacco products  no She claims she has gained no pounds since the start of her pregnancy.  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 no  Genetic Screening/Teratology Counseling: (Includes patient, baby's father, or anyone in either family with:)   1. Patient's age >/= 20 at [facility]  no 2. Thalassemia ([place], [place], Mediterranean, or Asian background): MCV<80  no 3. Neural tube defect (meningomyelocele, spina bifida, anencephaly)  no 4. Congenital heart defect  no  5. Down syndrome  no 6. Tay-Sachs (Jewish, [place]  no 7. Canavan's Disease  no 8. Sickle cell disease or trait (African)  no  9. Hemophilia or other blood disorders  no  10. Muscular dystrophy  no  11. Cystic fibrosis  no  12. Huntington's Chorea  no  13. Mental retardation/autism  no 14. Other  inherited genetic or chromosomal disorder  no 15. Maternal metabolic disorder (DM, PKU, etc)  no 16. Patient or FOB with a child with a birth defect not listed above no  16a. Patient or FOB with a birth defect themselves no 17. Recurrent pregnancy loss, or stillbirth  no  18. Any medications since LMP other than prenatal vitamins (include vitamins, supplements, OTC meds, drugs, alcohol)  no 19. Any other genetic/environmental exposure to discuss  no  Infection History:   1. Lives with someone with TB or TB exposed  no  2. Patient or partner has history of genital herpes  no 3. Rash or viral illness since LMP  no 4. History of STI (GC, CT, HPV, syphilis, HIV)  no 5. History of recent travel :  no  Other pertinent information:  no     Review of Systems:10 point review of systems negative unless otherwise noted in HPI  Past Medical History:  Patient Active Problem List   Diagnosis Date Noted   Supervision of normal pregnancy 01/14/2022     Nursing Staff Provider  Office Location  [facility] Dating    Language  English Anatomy US    Flu Vaccine   Genetic Screen  NIPS:   TDaP vaccine    Hgb A1C or  GTT Early : NA Third trimester :   Covid    LAB RESULTS   Rhogam   Blood Type     Feeding  Plan  Antibody    Contraception  Rubella    Circumcision  RPR     Pediatrician   HBsAg     Support Person Harrold Donath HIV    Prenatal Classes  Varicella     GBS  (For PCN allergy, check sensitivities)   BTL Consent     VBAC Consent NA Pap  2020 negative 01/14/22:       Hgb Electro    Pelvis Tested NA CF      SMA             Past Surgical History:  Past Surgical History:  Procedure Laterality Date   MOUTH SURGERY     x 3   TONSILLECTOMY     WISDOM TOOTH EXTRACTION      Gynecologic History: Patient's last menstrual period was 11/24/2021 (exact date).  Obstetric History: G1P0000  Family History:  Family History  Problem Relation Age of Onset   Non-Hodgkin's lymphoma Father     Colon polyps Father    Lymphoma Father    Breast cancer Paternal Grandmother 39   Colon polyps Mother    Irritable bowel syndrome Mother    GER disease Brother    Dementia Maternal Grandmother    Heart disease Maternal Grandfather    Diabetes Paternal Grandfather    Diverticulitis Paternal Grandfather    Breast cancer Paternal Aunt 59       "gene neg"    Social History:  Social History   Socioeconomic History   Marital status: Single    Spouse name: Not on file   Number of children: 0   Years of education: Not on file   Highest education level: Not on file  Occupational History   Occupation: Runner, broadcasting/film/video  Tobacco Use   Smoking status: Never   Smokeless tobacco: Never  Vaping Use   Vaping Use: Never used  Substance and Sexual Activity   Alcohol use: Yes    Comment: social   Drug use: No   Sexual activity: Yes    Partners: Male    Birth control/protection: Pill  Other Topics Concern   Not on file  Social History Narrative   Not on file   Social Determinants of Health   Financial Resource Strain: Not on file  Food Insecurity: Not on file  Transportation Needs: Not on file  Physical Activity: Not on file  Stress: Not on file  Social Connections: Not on file  Intimate Partner Violence: Not on file    Allergies:  Allergies  Allergen Reactions   Adhesive [Tape]    Other     Nickle, gold, stainless steel   Sulfa Antibiotics    Nickel Rash    Medications: Prior to Admission medications   Medication Sig Start Date End Date Taking? Authorizing Provider  fluticasone (FLONASE) 50 MCG/ACT nasal spray Place into the nose. 10/17/21  Yes [provider]  glycopyrrolate (ROBINUL) 1 MG tablet TAKE 1 TABLET(1 MG) BY MOUTH TWICE DAILY 02/12/21  Yes Meryl Dare, MD  hyoscyamine (LEVSIN) 0.125 MG tablet Take 1 tablet (0.125 mg total) by mouth every 4 (four) hours as needed. 02/12/21  Yes Meryl Dare, MD  levocetirizine (XYZAL) 5 MG tablet Take 5 mg by mouth  daily.   Yes [provider]  pantoprazole (PROTONIX) 40 MG tablet Take 1 tablet (40 mg total) by mouth daily. 02/12/21  Yes Meryl Dare, MD  Prenatal Vit-Fe Fumarate-FA (PRENATAL MULTIVITAMIN) TABS tablet Take 1 tablet by mouth daily at 12 noon.  Yes [provider]    Physical Exam Vitals: Blood pressure 100/60, weight 161 lb (73 kg), last menstrual period 11/24/2021.  General: NAD HEENT: normocephalic, anicteric Thyroid: no enlargement, no palpable nodules Pulmonary: No increased work of breathing, CTAB Cardiovascular: RRR, distal pulses 2+ Abdomen: NABS, soft, non-tender, non-distended.  Umbilicus without lesions.  No hepatomegaly, splenomegaly or masses palpable. No evidence of hernia  Genitourinary:  External: Normal external female genitalia.  Normal urethral meatus, normal  Bartholin's and Skene's glands.    Vagina: Normal vaginal mucosa, no evidence of prolapse.    Cervix: grossly normal appearance, no bleeding, no CMT Extremities: no edema, erythema, or tenderness Neurologic: Grossly intact Psychiatric: mood appropriate, affect full   The following were addressed during this visit:  Breastfeeding Education - Early initiation of breastfeeding    Comments: Keeps milk supply adequate, helps contract uterus and slow bleeding, and early milk is the perfect first food and is easy to digest.   - The importance of exclusive breastfeeding    Comments: Provides antibodies, Lower risk of breast and ovarian cancers, and type-2 diabetes,Helps your body recover, Reduced chance of SIDS.   - Risks of giving your baby anything other than breast milk if you are breastfeeding    Comments: Make the baby less content with breastfeeds, may make my baby more susceptible to illness, and may reduce my milk supply.   - The importance of early skin-to-skin contact    Comments:  Keeps baby warm and secure, helps keep babys blood sugar up and breathing steady, easier to  bond and breastfeed, and helps calm baby.  - Rooming-in on a 24-hour basis    Comments: Easier to learn baby's feeding cues, easier to bond and get to know each other, and encourages milk production.   - Feeding on demand or baby-led feeding    Comments: Helps prevent breastfeeding complications, helps bring in good milk supply, prevents under or overfeeding, and helps baby feel content and satisfied   - Frequent feeding to help assure optimal milk production    Comments: Making a full supply of milk requires frequent removal of milk from breasts, infant will eat 8-12 times in 24 hours, if separated from infant use breast massage, hand expression and/ or pumping to remove milk from breasts.   - Effective positioning and attachment    Comments: Helps my baby to get enough breast milk, helps to produce an adequate milk supply, and helps prevent nipple pain and damage   - Exclusive breastfeeding for the first 6 months    Comments: Builds a healthy milk supply and keeps it up, protects baby from sickness and disease, and breastmilk has everything your baby needs for the first 6 months.    Assessment: 31 y.o. G1P0000 at 10322w2d presenting to initiate prenatal care  Plan: 1) Avoid alcoholic beverages. 2) Patient encouraged not to smoke.  3) Discontinue the use of all non-medicinal drugs and chemicals.  4) Take prenatal vitamins daily.  5) Nutrition, food safety (fish, cheese advisories, and high nitrite foods) and exercise discussed. 6) Hospital and practice style discussed with cross coverage system.  7) Genetic Screening, such as with 1st Trimester Screening, cell free fetal DNA, AFP testing, and Ultrasound, as well as with amniocentesis and CVS as appropriate, is discussed with patient. At the conclusion of today's visit patient declined genetic testing 8) Patient is asked about travel to areas at risk for the [place] virus, and counseled to avoid travel and exposure to mosquitoes or sexual  partners who may have themselves been exposed to the virus. Testing is discussed, and will be ordered as appropriate.  9) Urine culture, PAPtima, NOB panel today 10) Return to clinic in 1 week for dating scan and ROB   Janet MallJane Ofelia Gilmore, CNM [facility] 01/14/2022, 3:44 PM

## 2022-01-15 ENCOUNTER — Encounter: Payer: Self-pay | Admitting: Advanced Practice Midwife

## 2022-01-15 LAB — CBC/D/PLT+RPR+RH+ABO+RUBIGG...
Antibody Screen: NEGATIVE
Basophils Absolute: 0.1 10*3/uL (ref 0.0–0.2)
Basos: 1 %
EOS (ABSOLUTE): 0.1 10*3/uL (ref 0.0–0.4)
Eos: 1 %
HCV Ab: <0.1 s/co ratio (ref 0.0–0.9)
HIV Screen 4th Generation wRfx: NONREACTIVE
Hematocrit: 39.1 % (ref 34.0–46.6)
Hemoglobin: 13 g/dL (ref 11.1–15.9)
Hepatitis B Surface Ag: NEGATIVE
Immature Grans (Abs): 0 10*3/uL (ref 0.0–0.1)
Immature Granulocytes: 0 %
Lymphocytes Absolute: 2 10*3/uL (ref 0.7–3.1)
Lymphs: 22 %
MCH: 30 pg (ref 26.6–33.0)
MCHC: 33.2 g/dL (ref 31.5–35.7)
MCV: 90 fL (ref 79–97)
Monocytes Absolute: 0.9 10*3/uL (ref 0.1–0.9)
Monocytes: 9 %
Neutrophils Absolute: 6.1 10*3/uL (ref 1.4–7.0)
Neutrophils: 67 %
Platelets: 317 10*3/uL (ref 150–450)
RBC: 4.33 x10E6/uL (ref 3.77–5.28)
RDW: 12.3 % (ref 11.7–15.4)
RPR Ser Ql: NONREACTIVE
Rh Factor: POSITIVE
Rubella Antibodies, IGG: 1.85 index (ref >0.99)
Varicella zoster IgG: <135 index — ABNORMAL LOW (ref >165)
WBC: 9.1 10*3/uL (ref 3.4–10.8)

## 2022-01-15 LAB — HCV INTERPRETATION

## 2022-01-16 ENCOUNTER — Ambulatory Visit
Admission: RE | Admit: 2022-01-16 | Discharge: 2022-01-16 | Disposition: A | Discharge status: Home / Self Care | Payer: BC Managed Care – PPO | Source: Ambulatory Visit | Attending: Advanced Practice Midwife | Admitting: Advanced Practice Midwife

## 2022-01-16 ENCOUNTER — Other Ambulatory Visit: Payer: Self-pay

## 2022-01-16 DIAGNOSIS — Z3401 Encounter for supervision of normal first pregnancy, first trimester: Secondary | ICD-10-CM

## 2022-01-16 DIAGNOSIS — Z369 Encounter for antenatal screening, unspecified: Secondary | ICD-10-CM

## 2022-01-16 DIAGNOSIS — Z3687 Encounter for antenatal screening for uncertain dates: Secondary | ICD-10-CM | POA: Diagnosis present

## 2022-01-16 DIAGNOSIS — Z3A01 Less than 8 weeks gestation of pregnancy: Secondary | ICD-10-CM | POA: Insufficient documentation

## 2022-01-16 LAB — CYTOLOGY - PAP
Chlamydia: NEGATIVE
Comment: NEGATIVE
Comment: NEGATIVE
Comment: NEGATIVE
Comment: NORMAL
Diagnosis: NEGATIVE
High risk HPV: NEGATIVE
Neisseria Gonorrhea: NEGATIVE
Trichomonas: NEGATIVE

## 2022-01-16 LAB — URINE CULTURE

## 2022-01-21 ENCOUNTER — Ambulatory Visit: Payer: BC Managed Care – PPO | Admitting: Gastroenterology

## 2022-01-21 ENCOUNTER — Encounter: Payer: Self-pay | Admitting: Gastroenterology

## 2022-01-21 VITALS — BP 114/60 | HR 60 | Ht 63.0 in | Wt 163.1 lb

## 2022-01-21 DIAGNOSIS — K580 Irritable bowel syndrome with diarrhea: Secondary | ICD-10-CM

## 2022-01-21 DIAGNOSIS — K219 Gastro-esophageal reflux disease without esophagitis: Secondary | ICD-10-CM

## 2022-01-21 MED ORDER — GLYCOPYRROLATE 1 MG PO TABS: ORAL_TABLET | ORAL | 11 refills | Status: DC

## 2022-01-21 MED ORDER — PANTOPRAZOLE SODIUM 40 MG PO TBEC: 40.0000 mg | DELAYED_RELEASE_TABLET | Freq: Every day | ORAL | 11 refills | Status: DC

## 2022-01-21 NOTE — Patient Instructions (Signed)
We have sent the following medications to your pharmacy for you to pick up at your convenience: glycopyrrolate and pantoprazole.   Please discuss continuing these medications with your OBGYN.  The [facility] providers would like to encourage you to use [facility] to communicate with providers for non-urgent requests or questions.  Due to long hold times on the telephone, sending your provider a message by [facility] may be a faster and more efficient way to get a response.  Please allow 48 business hours for a response.  Please remember that this is for non-urgent requests.   Thank you for choosing me and [facility].  Venita Lick. Pleas Koch., MD., Clementeen Graham

## 2022-01-21 NOTE — Progress Notes (Signed)
° ° °  History of Present Illness: This is a 30 year old female returning for follow-up of IBS-D and GERD.  She relates that she has discontinued pantoprazole and her reflux has been diet controlled for several months.  She tried to discontinue glycopyrrolate however she had significant diarrhea, urgency and so she resumed glycopyrrolate qam.  She is now [redacted] weeks pregnant.  She was evaluated by her CNM on January 24.  Current Medications, Allergies, Past Medical History, Past Surgical History, Family History and Social History were reviewed in [employer] record.   Physical Exam: General: Well developed, well nourished, no acute distress Head: Normocephalic and atraumatic Eyes: Sclerae anicteric, EOMI Ears: Normal auditory acuity Mouth: Not examined, mask on during Covid-19 pandemic Lungs: Clear throughout to auscultation Heart: Regular rate and rhythm; no murmurs, rubs or bruits Abdomen: Soft, non tender and non distended. No masses, hepatosplenomegaly or hernias noted. Normal Bowel sounds Rectal: Not done Musculoskeletal: Symmetrical with no gross deformities  Pulses:  Normal pulses noted Extremities: No clubbing, cyanosis, edema or deformities noted Neurological: Alert oriented x 4, grossly nonfocal Psychological:  Alert and cooperative. Normal mood and affect   Assessment and Recommendations:  GERD.  Follow antireflux measures.  If reflux symptoms become active during pregnancy resume pantoprazole 40 mg p.o. daily if OK'd by her CNM, OB. REV in 1 year.  IBS-D.  Continue glycopyrrolate 1 mg p.o. every morning.  May take a second dose of glycopyrrolate in the evening if symptoms are very effective.  Hyoscyamine 0.125 mg 1 p.o. every 4 hours as needed for breakthrough symptoms.  She is advised to OK glycopyrrolate and hyoscyamine use with her CNM, OB. REV in 1 year.

## 2022-01-23 ENCOUNTER — Encounter: Payer: BC Managed Care – PPO | Admitting: Licensed Practical Nurse

## 2022-01-28 ENCOUNTER — Other Ambulatory Visit: Payer: Self-pay

## 2022-01-28 ENCOUNTER — Encounter: Payer: Self-pay | Admitting: Licensed Practical Nurse

## 2022-01-28 ENCOUNTER — Ambulatory Visit (INDEPENDENT_AMBULATORY_CARE_PROVIDER_SITE_OTHER): Payer: BC Managed Care – PPO | Admitting: Licensed Practical Nurse

## 2022-01-28 VITALS — BP 120/80 | Wt 158.0 lb

## 2022-01-28 DIAGNOSIS — Z3A09 9 weeks gestation of pregnancy: Secondary | ICD-10-CM

## 2022-01-28 DIAGNOSIS — Z3400 Encounter for supervision of normal first pregnancy, unspecified trimester: Secondary | ICD-10-CM

## 2022-01-28 NOTE — Progress Notes (Signed)
Routine Prenatal Care Visit  Subjective  Janet Gilmore is a 31 y.o. G1P0000 at [redacted]w[redacted]d being seen today for ongoing prenatal care.  She is currently monitored for the following issues for this low-risk pregnancy and has Supervision of normal pregnancy on their problem list.  ----------------------------------------------------------------------------------- Patient reports  Got a sore throat last week, now with nasal congestion, was seen in a walk in clinic and was told she has Bronchitis .  Using a medicated nasal spray. Ok to take Delsym. Comfort measures reviewed.  -also reviewed IBS meds, there is limited data in regards pregnancy, weigh the risks and benefits of use, Janet Gilmore prefers to stay on the meds.  She went went 4 days without them ans had issues.   . Vag. Bleeding: None.   . Leaking Fluid denies.  ----------------------------------------------------------------------------------- The following portions of the patient's history were reviewed and updated as appropriate: allergies, current medications, past family history, past medical history, past social history, past surgical history and problem list. Problem list updated.  Objective  Blood pressure 120/80, weight 158 lb (71.7 kg), last menstrual period 11/24/2021. Pregravid weight 161 lb (73 kg) Total Weight Gain -3 lb (-1.361 kg) Urinalysis: Urine Protein    Urine Glucose    Fetal Status:           General:  Alert, oriented and cooperative. Patient is in no acute distress.  Skin: Skin is warm and dry. No rash noted.   Cardiovascular: Normal heart rate noted  Respiratory: Normal respiratory effort, no problems with respiration noted  Abdomen: Soft, gravid, appropriate for gestational age. Pain/Pressure: Absent     Pelvic:  Cervical exam deferred        Extremities: Normal range of motion.     Mental Status: Normal mood and affect. Normal behavior. Normal judgment and thought content.   Assessment   30 y.o. G1P0000 at [redacted]w[redacted]d  by  08/31/2022, by Last Menstrual Period presenting for routine prenatal visit  Plan   pregnancy1  Problems (from 01/14/22 to present)     Problem Noted Resolved   Supervision of normal pregnancy 01/14/2022 by Tresea Mall, CNM No   Overview Addendum 01/20/2022  8:41 AM by Tresea Mall, CNM     Nursing Staff Provider  Office Location  [facility] Dating  EDD by LMP c/w 7w u/s  Language  English Anatomy US    Flu Vaccine   Genetic Screen  NIPS:   TDaP vaccine    Hgb A1C or  GTT Early : NA Third trimester :   Covid    LAB RESULTS   Rhogam   Blood Type     Feeding Plan  Antibody    Contraception  Rubella    Circumcision  RPR     Pediatrician   HBsAg     Support Person Harrold Donath HIV    Prenatal Classes  Varicella     GBS  (For PCN allergy, check sensitivities)   BTL Consent     VBAC Consent NA Pap  2020 negative 01/14/22:       Hgb Electro    Pelvis Tested NA CF      SMA                    general obstetric precautions including but not limited to vaginal bleeding, contractions, leaking of fluid and fetal movement were reviewed in detail with the patient. Please refer to After Visit Summary for other counseling recommendations.   Return in about 4 weeks (around 02/25/2022)  for ROB. Carie Caddy, CNM  Domingo Pulse, [facility]  01/28/22  2:48 PM

## 2022-02-18 ENCOUNTER — Ambulatory Visit: Payer: BC Managed Care – PPO | Admitting: Obstetrics and Gynecology

## 2022-02-24 ENCOUNTER — Ambulatory Visit (INDEPENDENT_AMBULATORY_CARE_PROVIDER_SITE_OTHER): Payer: BC Managed Care – PPO | Admitting: Advanced Practice Midwife

## 2022-02-24 ENCOUNTER — Encounter: Payer: Self-pay | Admitting: Advanced Practice Midwife

## 2022-02-24 ENCOUNTER — Other Ambulatory Visit: Payer: Self-pay

## 2022-02-24 VITALS — BP 118/78 | Wt 164.0 lb

## 2022-02-24 DIAGNOSIS — Z3A13 13 weeks gestation of pregnancy: Secondary | ICD-10-CM

## 2022-02-24 DIAGNOSIS — Z3402 Encounter for supervision of normal first pregnancy, second trimester: Secondary | ICD-10-CM

## 2022-02-24 NOTE — Progress Notes (Signed)
Routine Prenatal Care Visit ? ?Subjective  ?Janet Gilmore is a 31 y.o. G1P0000 at [redacted]w[redacted]d being seen today for ongoing prenatal care.  She is currently monitored for the following issues for this low-risk pregnancy and has Supervision of normal pregnancy on their problem list.  ?----------------------------------------------------------------------------------- ?Patient reports she is feeling well. She wonders if it is ok for her to go to gun range so she can obtain conceal and carry permit. Discussed it is best to avoid that level of noise during pregnancy.   ? . Vag. Bleeding: None.   . Leaking Fluid denies.  ?----------------------------------------------------------------------------------- ?The following portions of the patient's history were reviewed and updated as appropriate: allergies, current medications, past family history, past medical history, past social history, past surgical history and problem list. Problem list updated. ? ?Objective  ?Blood pressure 118/78, weight 164 lb (74.4 kg), last menstrual period 11/24/2021. ?Pregravid weight 161 lb (73 kg) Total Weight Gain 3 lb (1.361 kg) ?Urinalysis: Urine Protein    Urine Glucose   ? ?Fetal Status: Fetal Heart Rate (bpm): 166        ? ?General:  Alert, oriented and cooperative. Patient is in no acute distress.  ?Skin: Skin is warm and dry. No rash noted.   ?Cardiovascular: Normal heart rate noted  ?Respiratory: Normal respiratory effort, no problems with respiration noted  ?Abdomen: Soft, gravid, appropriate for gestational age.       ?Pelvic:  Cervical exam deferred        ?Extremities: Normal range of motion.  Edema: None  ?Mental Status: Normal mood and affect. Normal behavior. Normal judgment and thought content.  ? ?Assessment  ? ?31 y.o. G1P0000 at [redacted]w[redacted]d by  08/31/2022, by Last Menstrual Period presenting for routine prenatal visit ? ?Plan  ? ?pregnancy1  Problems (from 01/14/22 to present)   ? Problem Noted Resolved  ? Supervision of normal  pregnancy 01/14/2022 by Rod Can, CNM No  ? Overview Addendum 01/28/2022  2:49 PM by Allen Derry, CNM  ?   ?Nursing Staff Provider  ?Office Location  [facility] Dating  EDD by LMP c/w 7w u/s  ?Language  English Anatomy US    ?Flu Vaccine  10/31/21 Genetic Screen  NIPS:   ?TDaP vaccine    Hgb A1C or  ?GTT Early : NA ?Third trimester :   ?Covid    LAB RESULTS   ?Rhogam  NA Blood Type O/Positive/-- (01/24 1541)   ?Feeding Plan  Antibody Negative (01/24 1541)  ?Contraception  Rubella 1.85 (01/24 1541)  ?Circumcision  RPR Non Reactive (01/24 1541)   ?Pediatrician   HBsAg Negative (01/24 1541)   ?Support Person Ovid Curd HIV Non Reactive (01/24 1541)  ?Prenatal Classes  Varicella   ?  GBS  (For PCN allergy, check sensitivities)   ?BTL Consent     ?VBAC Consent NA Pap  2020 negative 01/14/22:     ?  Hgb Electro    ?Pelvis Tested NA CF   ?   SMA   ?     ? ?Hx IBS  ?  ?  ?  ?  ? ?Preterm labor symptoms and general obstetric precautions including but not limited to vaginal bleeding, contractions, leaking of fluid and fetal movement were reviewed in detail with the patient. ?Please refer to After Visit Summary for other counseling recommendations.  ? ?Return in about 4 weeks (around 03/24/2022) for rob . ? ?Rod Can, CNM ?02/24/2022 4:29 PM   ? ?

## 2022-02-24 NOTE — Progress Notes (Signed)
No vb. No lof.  

## 2022-02-24 NOTE — Patient Instructions (Signed)

## 2022-02-25 ENCOUNTER — Encounter: Payer: BC Managed Care – PPO | Admitting: Advanced Practice Midwife

## 2022-02-26 ENCOUNTER — Encounter: Payer: Self-pay | Admitting: Advanced Practice Midwife

## 2022-02-27 ENCOUNTER — Other Ambulatory Visit: Payer: Self-pay

## 2022-02-27 ENCOUNTER — Ambulatory Visit (INDEPENDENT_AMBULATORY_CARE_PROVIDER_SITE_OTHER): Payer: BC Managed Care – PPO | Admitting: Licensed Practical Nurse

## 2022-02-27 VITALS — BP 120/74

## 2022-02-27 DIAGNOSIS — R300 Dysuria: Secondary | ICD-10-CM

## 2022-02-27 DIAGNOSIS — R3915 Urgency of urination: Secondary | ICD-10-CM | POA: Diagnosis not present

## 2022-02-27 DIAGNOSIS — Z3A13 13 weeks gestation of pregnancy: Secondary | ICD-10-CM

## 2022-02-27 LAB — POCT URINALYSIS DIPSTICK
Bilirubin, UA: NEGATIVE
Blood, UA: NEGATIVE
Glucose, UA: NEGATIVE
Ketones, UA: NEGATIVE
Leukocytes, UA: NEGATIVE
Nitrite, UA: NEGATIVE
Protein, UA: NEGATIVE
Spec Grav, UA: 1.02 (ref 1.010–1.025)
Urobilinogen, UA: NEGATIVE E.U./dL — AB
pH, UA: 6 (ref 5.0–8.0)

## 2022-02-27 NOTE — Progress Notes (Signed)
Routine Prenatal Care Visit ? ?Subjective  ?Janet Gilmore is a 31 y.o. G1P0000 at [redacted]w[redacted]d being seen today for ongoing prenatal care.  She is currently monitored for the following issues for this low-risk pregnancy and has Supervision of normal pregnancy on their problem list.  ?----------------------------------------------------------------------------------- ?Patient reports  UTI symptoms x 1 day .  Yesterday while at work she had an urgency to use the BR, she urinated, then felt like she needed to void again, so she did, she had the sensation again-ignored it until she felt like she had to go. She had a burning sensation while urinating too. Also had some lower right sided pain. Denies flank pain or fevers. When she was a young adult she once had 4-5 UTI's in a one year-the uti's went away when she started hormonal birth control, is worried about having frequent UTI's in pregnancy.  She has not had those symptoms today, The only thing different is that she had coffee yesterday morning.  ? . Vag. Bleeding: None.  Movement: Absent. Leaking Fluid denies.  ?----------------------------------------------------------------------------------- ?The following portions of the patient's history were reviewed and updated as appropriate: allergies, current medications, past family history, past medical history, past social history, past surgical history and problem list. Problem list updated. ? ?Objective  ?Blood pressure 120/74, last menstrual period 11/24/2021. ?Pregravid weight 161 lb (73 kg) Total Weight Gain 3 lb (1.361 kg) ?Urinalysis: Urine Protein    Urine Glucose   ? ?Fetal Status:     Movement: Absent    ? ?General:  Alert, oriented and cooperative. Patient is in no acute distress.  ?Skin: Skin is warm and dry. No rash noted.   ?Cardiovascular: Normal heart rate noted  ?Respiratory: Normal respiratory effort, no problems with respiration noted  ?Abdomen: Soft, gravid, appropriate for gestational age.  Pain/Pressure: Absent   non tender   ?Pelvic:  Cervical exam deferred        ?Extremities: Normal range of motion.     ?Mental Status: Normal mood and affect. Normal behavior. Normal judgment and thought content.  ? ?Negative CVAT ?Assessment  ? ?31 y.o. G1P0000 at [redacted]w[redacted]d by  08/31/2022, by Last Menstrual Period presenting for work-in prenatal visit ? ?Plan  ? ?pregnancy1  Problems (from 01/14/22 to present)   ? ? Problem Noted Resolved  ? Supervision of normal pregnancy 01/14/2022 by Tresea Mall, CNM No  ? Overview Addendum 01/28/2022  2:49 PM by Ellwood Sayers, CNM  ?   ?Nursing Staff Provider  ?Office Location  [facility] Dating  EDD by LMP c/w 7w u/s  ?Language  English Anatomy US    ?Flu Vaccine  10/31/21 Genetic Screen  NIPS:   ?TDaP vaccine    Hgb A1C or  ?GTT Early : NA ?Third trimester :   ?Covid    LAB RESULTS   ?Rhogam  NA Blood Type O/Positive/-- (01/24 1541)   ?Feeding Plan  Antibody Negative (01/24 1541)  ?Contraception  Rubella 1.85 (01/24 1541)  ?Circumcision  RPR Non Reactive (01/24 1541)   ?Pediatrician   HBsAg Negative (01/24 1541)   ?Support Person Harrold Donath HIV Non Reactive (01/24 1541)  ?Prenatal Classes  Varicella   ?  GBS  (For PCN allergy, check sensitivities)   ?BTL Consent     ?VBAC Consent NA Pap  2020 negative 01/14/22:     ?  Hgb Electro    ?Pelvis Tested NA CF   ?   SMA   ?     ? ?Hx IBS  ?  ?  ? ?  ?  ?  general obstetric precautions including but not limited to vaginal bleeding, contractions, leaking of fluid and fetal movement were reviewed in detail with the patient. ?Please refer to After Visit Summary for other counseling recommendations.  ? ?Reviewed bladder irritants consider keeping a diary of symptoms and food intake.  ? ?Keep next ROB  ? ?Urine culture sent ? ?Carie Caddy, CNM  ?Domingo Pulse, [facility]  ?02/27/22  ?4:46 PM  ? ? ?

## 2022-02-27 NOTE — Progress Notes (Signed)
Work in [employer] for UTI symptoms. Urinary frequency, dysuria,  ?

## 2022-03-04 LAB — URINE CULTURE

## 2022-03-22 ENCOUNTER — Encounter: Payer: Self-pay | Admitting: Licensed Practical Nurse

## 2022-03-24 ENCOUNTER — Ambulatory Visit (INDEPENDENT_AMBULATORY_CARE_PROVIDER_SITE_OTHER): Payer: BC Managed Care – PPO | Admitting: Licensed Practical Nurse

## 2022-03-24 ENCOUNTER — Other Ambulatory Visit (HOSPITAL_COMMUNITY)
Admission: RE | Admit: 2022-03-24 | Discharge: 2022-03-24 | Disposition: A | Discharge status: Home / Self Care | Payer: BC Managed Care – PPO | Source: Ambulatory Visit | Attending: Licensed Practical Nurse | Admitting: Licensed Practical Nurse

## 2022-03-24 ENCOUNTER — Encounter: Payer: Self-pay | Admitting: Licensed Practical Nurse

## 2022-03-24 VITALS — BP 120/70 | Wt 168.0 lb

## 2022-03-24 DIAGNOSIS — Z3482 Encounter for supervision of other normal pregnancy, second trimester: Secondary | ICD-10-CM | POA: Diagnosis present

## 2022-03-24 DIAGNOSIS — Z3A18 18 weeks gestation of pregnancy: Secondary | ICD-10-CM

## 2022-03-24 DIAGNOSIS — N898 Other specified noninflammatory disorders of vagina: Secondary | ICD-10-CM | POA: Diagnosis present

## 2022-03-24 LAB — POCT URINALYSIS DIPSTICK OB
Glucose, UA: NEGATIVE
POC,PROTEIN,UA: NEGATIVE

## 2022-03-24 NOTE — Progress Notes (Signed)
Routine Prenatal Care Visit ? ?Subjective  ?Janet Gilmore is a 31 y.o. G1P0000 at [redacted]w[redacted]d being seen today for ongoing prenatal care.  She is currently monitored for the following issues for this low-risk pregnancy and has Supervision of normal pregnancy on their problem list.  ?----------------------------------------------------------------------------------- ?Patient reports  was treated for Strep throat last week has had vaginal itching/discomfort and white discharge since last Thursday . Is on Day 2 of monistat 7 treatment.  ?-otherwise doing well, feels good.  Interested in in person CBE ?-will be on spring break at the end of this week, plans to stay home and get ready for the baby, they just got a boat too.  ?Contractions: Not present. Vag. Bleeding: None.  Movement: Absent. Leaking Fluid denies.  ?----------------------------------------------------------------------------------- ?The following portions of the patient's history were reviewed and updated as appropriate: allergies, current medications, past family history, past medical history, past social history, past surgical history and problem list. Problem list updated. ? ?Objective  ?Blood pressure 120/70, weight 168 lb (76.2 kg), last menstrual period 11/24/2021. ?Pregravid weight 161 lb (73 kg) Total Weight Gain 7 lb (3.175 kg) ?Urinalysis: Urine Protein    Urine Glucose   ? ?Fetal Status:     Movement: Absent    ? ?General:  Alert, oriented and cooperative. Patient is in no acute distress.  ?Skin: Skin is warm and dry. No rash noted.   ?Cardiovascular: Normal heart rate noted  ?Respiratory: Normal respiratory effort, no problems with respiration noted  ?Abdomen: Soft, gravid, appropriate for gestational age. Pain/Pressure: Absent     ?Pelvic:  Cervical exam deferred      spec exam: cervix pink no lesions, white thick discharge adherent to cervix and vaginal walls   ?Extremities: Normal range of motion.     ?Mental Status: Normal mood and affect.  Normal behavior. Normal judgment and thought content.  ? ?Assessment  ? ?31 y.o. G1P0000 at [redacted]w[redacted]d by  08/31/2022, by Last Menstrual Period presenting for routine prenatal visit ? ?Plan  ? ?pregnancy1  Problems (from 01/14/22 to present)   ? ? Problem Noted Resolved  ? Supervision of normal pregnancy 01/14/2022 by Tresea Mall, CNM No  ? Overview Addendum 01/28/2022  2:49 PM by Ellwood Sayers, CNM  ?   ?Nursing Staff Provider  ?Office Location  [facility] Dating  EDD by LMP c/w 7w u/s  ?Language  English Anatomy US    ?Flu Vaccine  10/31/21 Genetic Screen  NIPS:   ?TDaP vaccine    Hgb A1C or  ?GTT Early : NA ?Third trimester :   ?Covid    LAB RESULTS   ?Rhogam  NA Blood Type O/Positive/-- (01/24 1541)   ?Feeding Plan  Antibody Negative (01/24 1541)  ?Contraception  Rubella 1.85 (01/24 1541)  ?Circumcision  RPR Non Reactive (01/24 1541)   ?Pediatrician   HBsAg Negative (01/24 1541)   ?Support Person Harrold Donath HIV Non Reactive (01/24 1541)  ?Prenatal Classes  Varicella   ?  GBS  (For PCN allergy, check sensitivities)   ?BTL Consent     ?VBAC Consent NA Pap  2020 negative 01/14/22:     ?  Hgb Electro    ?Pelvis Tested NA CF   ?   SMA   ?     ? ?Hx IBS  ?  ?  ? ?  ?  ? ?general obstetric precautions including but not limited to vaginal bleeding, contractions, leaking of fluid and fetal movement were reviewed in detail with the patient. ?Please  refer to After Visit Summary for other counseling recommendations.  ? ?Return in about 4 weeks (around 04/21/2022) for ROB. ? ?Swab sent for yeast, will continue Monistat 7 ? ?Order for anatomy US placed, to to call to schedule  ? ?Carie Caddy, CNM  ?Domingo Pulse, [facility]  ?03/24/22  ?4:22 PM  ? ? ?

## 2022-03-26 LAB — CERVICOVAGINAL ANCILLARY ONLY
Bacterial Vaginitis (gardnerella): NEGATIVE
Candida Glabrata: NEGATIVE
Candida Vaginitis: NEGATIVE
Comment: NEGATIVE
Comment: NEGATIVE
Comment: NEGATIVE

## 2022-04-02 ENCOUNTER — Ambulatory Visit
Admission: RE | Admit: 2022-04-02 | Discharge: 2022-04-02 | Disposition: A | Discharge status: Home / Self Care | Payer: BC Managed Care – PPO | Source: Ambulatory Visit | Attending: Licensed Practical Nurse | Admitting: Licensed Practical Nurse

## 2022-04-02 DIAGNOSIS — Z3482 Encounter for supervision of other normal pregnancy, second trimester: Secondary | ICD-10-CM | POA: Insufficient documentation

## 2022-04-02 DIAGNOSIS — Z3A18 18 weeks gestation of pregnancy: Secondary | ICD-10-CM | POA: Insufficient documentation

## 2022-04-02 DIAGNOSIS — Z3689 Encounter for other specified antenatal screening: Secondary | ICD-10-CM | POA: Diagnosis not present

## 2022-04-02 DIAGNOSIS — O321XX0 Maternal care for breech presentation, not applicable or unspecified: Secondary | ICD-10-CM | POA: Diagnosis not present

## 2022-04-21 ENCOUNTER — Ambulatory Visit (INDEPENDENT_AMBULATORY_CARE_PROVIDER_SITE_OTHER): Payer: BC Managed Care – PPO | Admitting: Advanced Practice Midwife

## 2022-04-21 ENCOUNTER — Encounter: Payer: Self-pay | Admitting: Advanced Practice Midwife

## 2022-04-21 ENCOUNTER — Encounter: Payer: BC Managed Care – PPO | Admitting: Licensed Practical Nurse

## 2022-04-21 VITALS — BP 122/60 | Wt 175.0 lb

## 2022-04-21 DIAGNOSIS — Z369 Encounter for antenatal screening, unspecified: Secondary | ICD-10-CM

## 2022-04-21 DIAGNOSIS — Z3402 Encounter for supervision of normal first pregnancy, second trimester: Secondary | ICD-10-CM

## 2022-04-21 DIAGNOSIS — Z3A21 21 weeks gestation of pregnancy: Secondary | ICD-10-CM

## 2022-04-21 LAB — POCT URINALYSIS DIPSTICK OB
Glucose, UA: NEGATIVE
POC,PROTEIN,UA: NEGATIVE

## 2022-04-21 NOTE — Addendum Note (Signed)
Addended by: Inis Sizer on: 04/21/2022 04:23 PM ? ? Modules accepted: Orders ? ?

## 2022-04-21 NOTE — Patient Instructions (Signed)
Round Ligament Pain  The round ligaments are a pair of cord-like tissues that help support the uterus. They can become a source of pain during pregnancy as the ligaments soften and stretch as the baby grows. The pain usually begins in the second trimester (13-28 weeks) of pregnancy, and should only last for a few seconds when it occurs. However, the pain can come and go until the baby is delivered. The pain does not cause harm to the baby. Round ligament pain is usually a short, sharp, and pinching pain, but it can also be a dull, lingering, and aching pain. The pain is felt in the lower side of the abdomen or in the groin. It usually starts deep in the groin and moves up to the outside of the hip area. The pain may happen when you: Suddenly change position, such as quickly going from a sitting to standing position. Do physical activity. Cough or sneeze. Follow these instructions at home: Managing pain  When the pain starts, relax. Then, try any of these methods to help with the pain: Sit down. Flex your knees up to your abdomen. Lie on your side with one pillow under your abdomen and another pillow between your legs. Sit in a warm bath for 15-20 minutes or until the pain goes away. General instructions Watch your condition for any changes. Move slowly when you sit down or stand up. Stop or reduce your physical activities if they cause pain. Avoid long walks if they cause pain. Take over-the-counter and prescription medicines only as told by your health care provider. Keep all follow-up visits. This is important. Contact a health care provider if: Your pain does not go away with treatment. You feel pain in your back that you did not have before. Your medicine is not helping. You have a fever or chills. You have nausea or vomiting. You have diarrhea. You have pain when you urinate. Get help right away if: You have pain that is a rhythmic, cramping pain similar to labor pains. Labor  pains are usually 2 minutes apart, last for about 1 minute, and involve a bearing down feeling or pressure in your pelvis. You have vaginal bleeding. These symptoms may represent a serious problem that is an emergency. Do not wait to see if the symptoms will go away. Get medical help right away. Call your local emergency services (911 in the [place] Do not drive yourself to the hospital. Summary Round ligament pain is felt in the lower abdomen or groin. This pain usually begins in the second trimester (13-28 weeks) and should only last for a few seconds when it occurs. You may notice the pain when you suddenly change position, when you cough or sneeze, or during physical activity. Relaxing, flexing your knees to your abdomen, lying on one side, or taking a warm bath may help to get rid of the pain. Contact your health care provider if the pain does not go away. This information is not intended to replace advice given to you by your health care provider. Make sure you discuss any questions you have with your health care provider. Document Revised: 02/20/2021 Document Reviewed: 02/20/2021 Elsevier Patient Education  2023 [employer]vier Inc.  

## 2022-04-21 NOTE — Progress Notes (Addendum)
Routine Prenatal Care Visit ? ?Subjective  ?Janet Gilmore is a 31 y.o. G1P0000 at [redacted]w[redacted]d being seen today for ongoing prenatal care.  She is currently monitored for the following issues for this low-risk pregnancy and has Supervision of normal pregnancy on their problem list.  ?----------------------------------------------------------------------------------- ?Patient reports  some twinges in bilateral lower abdomen- likely round ligament. Comfort measures reviewed .   ?Contractions: Not present. Vag. Bleeding: None.  Movement: Absent. Leaking Fluid denies.  ?----------------------------------------------------------------------------------- ?The following portions of the patient's history were reviewed and updated as appropriate: allergies, current medications, past family history, past medical history, past social history, past surgical history and problem list. Problem list updated. ? ?Objective  ?Blood pressure 122/60, weight 175 lb (79.4 kg), last menstrual period 11/24/2021. ?Pregravid weight 161 lb (73 kg) Total Weight Gain 14 lb (6.35 kg) ?Urinalysis: Urine Protein    Urine Glucose   ? ?Fetal Status: Fetal Heart Rate (bpm): 153 Fundal Height: 21 cm Movement: Absent    ? ?Anatomy scan 04/02/2022: ?Incomplete for fetal profile and otherwise complete/normal, female, placenta posterior ? ?General:  Alert, oriented and cooperative. Patient is in no acute distress.  ?Skin: Skin is warm and dry. No rash noted.   ?Cardiovascular: Normal heart rate noted  ?Respiratory: Normal respiratory effort, no problems with respiration noted  ?Abdomen: Soft, gravid, appropriate for gestational age. Pain/Pressure: Absent     ?Pelvic:  Cervical exam deferred        ?Extremities: Normal range of motion.  Edema: None  ?Mental Status: Normal mood and affect. Normal behavior. Normal judgment and thought content.  ? ?Assessment  ? ?31 y.o. G1P0000 at [redacted]w[redacted]d by  08/31/2022, by Last Menstrual Period presenting for routine prenatal  visit ? ?Plan  ? ?pregnancy1  Problems (from 01/14/22 to present)   ? ? Problem Noted Resolved  ? Supervision of normal pregnancy 01/14/2022 by Tresea Mall, CNM No  ? Overview Addendum 01/28/2022  2:49 PM by Ellwood Sayers, CNM  ?   ?Nursing Staff Provider  ?Office Location  [facility] Dating  EDD by LMP c/w 7w u/s  ?Language  English Anatomy US    ?Flu Vaccine  10/31/21 Genetic Screen  NIPS:   ?TDaP vaccine    Hgb A1C or  ?GTT Early : NA ?Third trimester :   ?Covid    LAB RESULTS   ?Rhogam  NA Blood Type O/Positive/-- (01/24 1541)   ?Feeding Plan  Antibody Negative (01/24 1541)  ?Contraception  Rubella 1.85 (01/24 1541)  ?Circumcision  RPR Non Reactive (01/24 1541)   ?Pediatrician   HBsAg Negative (01/24 1541)   ?Support Person Harrold Donath HIV Non Reactive (01/24 1541)  ?Prenatal Classes  Varicella   ?  GBS  (For PCN allergy, check sensitivities)   ?BTL Consent     ?VBAC Consent NA Pap  2020 negative 01/14/22:     ?  Hgb Electro    ?Pelvis Tested NA CF   ?   SMA   ?     ? ?Hx IBS  ?  ?  ? ?  ?  ? ?Preterm labor symptoms and general obstetric precautions including but not limited to vaginal bleeding, contractions, leaking of fluid and fetal movement were reviewed in detail with the patient. ?Please refer to After Visit Summary for other counseling recommendations.  ? ?Return in about 2 weeks (around 05/05/2022) for f/u anatomy in 2 weeks and rob in 3 weeks. ? ?Tresea Mall, CNM ?04/21/2022 4:14 PM   ? ?

## 2022-05-05 ENCOUNTER — Ambulatory Visit
Admission: RE | Admit: 2022-05-05 | Discharge: 2022-05-05 | Disposition: A | Discharge status: Home / Self Care | Payer: BC Managed Care – PPO | Source: Ambulatory Visit | Attending: Advanced Practice Midwife | Admitting: Advanced Practice Midwife

## 2022-05-05 DIAGNOSIS — Z3A24 24 weeks gestation of pregnancy: Secondary | ICD-10-CM | POA: Insufficient documentation

## 2022-05-05 DIAGNOSIS — Z3689 Encounter for other specified antenatal screening: Secondary | ICD-10-CM | POA: Diagnosis present

## 2022-05-05 DIAGNOSIS — Z369 Encounter for antenatal screening, unspecified: Secondary | ICD-10-CM

## 2022-05-05 DIAGNOSIS — Z3402 Encounter for supervision of normal first pregnancy, second trimester: Secondary | ICD-10-CM

## 2022-05-12 ENCOUNTER — Encounter: Payer: Self-pay | Admitting: Advanced Practice Midwife

## 2022-05-12 ENCOUNTER — Ambulatory Visit (INDEPENDENT_AMBULATORY_CARE_PROVIDER_SITE_OTHER): Payer: BC Managed Care – PPO | Admitting: Advanced Practice Midwife

## 2022-05-12 VITALS — BP 120/80 | Wt 179.0 lb

## 2022-05-12 DIAGNOSIS — Z3402 Encounter for supervision of normal first pregnancy, second trimester: Secondary | ICD-10-CM

## 2022-05-12 DIAGNOSIS — Z113 Encounter for screening for infections with a predominantly sexual mode of transmission: Secondary | ICD-10-CM

## 2022-05-12 DIAGNOSIS — Z130 Encounter for screening for diseases of the blood and blood-forming organs and certain disorders involving the immune mechanism: Secondary | ICD-10-CM

## 2022-05-12 DIAGNOSIS — Z3A24 24 weeks gestation of pregnancy: Secondary | ICD-10-CM

## 2022-05-12 DIAGNOSIS — Z131 Encounter for screening for diabetes mellitus: Secondary | ICD-10-CM

## 2022-05-12 DIAGNOSIS — Z369 Encounter for antenatal screening, unspecified: Secondary | ICD-10-CM

## 2022-05-12 LAB — POCT URINALYSIS DIPSTICK OB
Glucose, UA: NEGATIVE
POC,PROTEIN,UA: NEGATIVE

## 2022-05-12 NOTE — Progress Notes (Signed)
Routine Prenatal Care Visit  Subjective  SHAMYIA GRANDPRE is a 31 y.o. G1P0000 at [redacted]w[redacted]d being seen today for ongoing prenatal care.  She is currently monitored for the following issues for this low-risk pregnancy and has Supervision of normal pregnancy on their problem list.  ----------------------------------------------------------------------------------- Patient reports round ligament pain on left.   Contractions: Not present. Vag. Bleeding: None.  Movement: Present. Leaking Fluid denies.  ----------------------------------------------------------------------------------- The following portions of the patient's history were reviewed and updated as appropriate: allergies, current medications, past family history, past medical history, past social history, past surgical history and problem list. Problem list updated.  Objective  Blood pressure 120/80, weight 179 lb (81.2 kg), last menstrual period 11/24/2021. Pregravid weight 161 lb (73 kg) Total Weight Gain 18 lb (8.165 kg) Urinalysis: Urine Protein    Urine Glucose    Fetal Status: Fetal Heart Rate (bpm): 145 Fundal Height: 24 cm Movement: Present     General:  Alert, oriented and cooperative. Patient is in no acute distress.  Skin: Skin is warm and dry. No rash noted.   Cardiovascular: Normal heart rate noted  Respiratory: Normal respiratory effort, no problems with respiration noted  Abdomen: Soft, gravid, appropriate for gestational age. Pain/Pressure: Present     Pelvic:  Cervical exam deferred        Extremities: Normal range of motion.  Edema: None  Mental Status: Normal mood and affect. Normal behavior. Normal judgment and thought content.   Assessment   31 y.o. G1P0000 at 102w1d by  08/31/2022, by Last Menstrual Period presenting for routine prenatal visit  Plan   pregnancy1  Problems (from 01/14/22 to present)    Problem Noted Resolved   Supervision of normal pregnancy 01/14/2022 by Tresea Mall, CNM No   Overview  Addendum 01/28/2022  2:49 PM by Ellwood Sayers, CNM     Nursing Staff Provider  Office Location  [facility] Dating  EDD by LMP c/w 7w u/s  Language  English Anatomy US    Flu Vaccine  10/31/21 Genetic Screen  NIPS:   TDaP vaccine    Hgb A1C or  GTT Early : NA Third trimester :   Covid    LAB RESULTS   Rhogam  NA Blood Type O/Positive/-- (01/24 1541)   Feeding Plan  Antibody Negative (01/24 1541)  Contraception  Rubella 1.85 (01/24 1541)  Circumcision  RPR Non Reactive (01/24 1541)   Pediatrician   HBsAg Negative (01/24 1541)   Support Person Harrold Donath HIV Non Reactive (01/24 1541)  Prenatal Classes  Varicella     GBS  (For PCN allergy, check sensitivities)   BTL Consent     VBAC Consent NA Pap  2020 negative 01/14/22:       Hgb Electro    Pelvis Tested NA CF      SMA         Hx IBS           Preterm labor symptoms and general obstetric precautions including but not limited to vaginal bleeding, contractions, leaking of fluid and fetal movement were reviewed in detail with the patient. Please refer to After Visit Summary for other counseling recommendations.   Return in about 4 weeks (around 06/09/2022) for 28w labs and rob.  Tresea Mall, CNM 05/12/2022 4:12 PM

## 2022-05-12 NOTE — Addendum Note (Signed)
Addended by: Cornelius Moras D on: 05/12/2022 04:27 PM   Modules accepted: Orders

## 2022-05-20 ENCOUNTER — Encounter: Payer: Self-pay | Admitting: Advanced Practice Midwife

## 2022-06-16 ENCOUNTER — Other Ambulatory Visit: Payer: BC Managed Care – PPO

## 2022-06-16 ENCOUNTER — Encounter: Payer: BC Managed Care – PPO | Admitting: Obstetrics

## 2022-06-16 ENCOUNTER — Ambulatory Visit (INDEPENDENT_AMBULATORY_CARE_PROVIDER_SITE_OTHER): Payer: BC Managed Care – PPO | Admitting: Obstetrics

## 2022-06-16 ENCOUNTER — Encounter: Payer: Self-pay | Admitting: Obstetrics

## 2022-06-16 VITALS — BP 124/70 | Wt 188.4 lb

## 2022-06-16 DIAGNOSIS — Z3402 Encounter for supervision of normal first pregnancy, second trimester: Secondary | ICD-10-CM

## 2022-06-16 DIAGNOSIS — Z23 Encounter for immunization: Secondary | ICD-10-CM | POA: Diagnosis not present

## 2022-06-16 DIAGNOSIS — Z3A29 29 weeks gestation of pregnancy: Secondary | ICD-10-CM

## 2022-06-16 DIAGNOSIS — Z130 Encounter for screening for diseases of the blood and blood-forming organs and certain disorders involving the immune mechanism: Secondary | ICD-10-CM

## 2022-06-16 DIAGNOSIS — Z131 Encounter for screening for diabetes mellitus: Secondary | ICD-10-CM

## 2022-06-16 DIAGNOSIS — Z369 Encounter for antenatal screening, unspecified: Secondary | ICD-10-CM

## 2022-06-16 DIAGNOSIS — Z113 Encounter for screening for infections with a predominantly sexual mode of transmission: Secondary | ICD-10-CM

## 2022-06-16 LAB — POCT URINALYSIS DIPSTICK OB
Bilirubin, UA: NEGATIVE
Blood, UA: NEGATIVE
Glucose, UA: NEGATIVE
Ketones, UA: NEGATIVE
Leukocytes, UA: NEGATIVE
Nitrite, UA: NEGATIVE
POC,PROTEIN,UA: NEGATIVE
Spec Grav, UA: 1.01 (ref 1.010–1.025)
Urobilinogen, UA: 0.2 E.U./dL
pH, UA: 7.5 (ref 5.0–8.0)

## 2022-06-16 NOTE — Progress Notes (Signed)
ROB. Patient states fetal movement with back pain. Patient completed GCT, received TDAP injection and signed BTC today. Patient states no questions or concerns at this time.

## 2022-06-17 ENCOUNTER — Encounter: Payer: Self-pay | Admitting: Advanced Practice Midwife

## 2022-06-17 LAB — 28 WEEK RH+PANEL
Basophils Absolute: 0.1 10*3/uL (ref 0.0–0.2)
Basos: 0 %
EOS (ABSOLUTE): 0.1 10*3/uL (ref 0.0–0.4)
Eos: 1 %
Gestational Diabetes Screen: 73 mg/dL (ref 70–139)
HIV Screen 4th Generation wRfx: NONREACTIVE
Hematocrit: 37.5 % (ref 34.0–46.6)
Hemoglobin: 12.3 g/dL (ref 11.1–15.9)
Immature Grans (Abs): 0.1 10*3/uL (ref 0.0–0.1)
Immature Granulocytes: 1 %
Lymphocytes Absolute: 1.7 10*3/uL (ref 0.7–3.1)
Lymphs: 14 %
MCH: 28.7 pg (ref 26.6–33.0)
MCHC: 32.8 g/dL (ref 31.5–35.7)
MCV: 87 fL (ref 79–97)
Monocytes Absolute: 0.9 10*3/uL (ref 0.1–0.9)
Monocytes: 7 %
Neutrophils Absolute: 9.2 10*3/uL — ABNORMAL HIGH (ref 1.4–7.0)
Neutrophils: 77 %
Platelets: 317 10*3/uL (ref 150–450)
RBC: 4.29 x10E6/uL (ref 3.77–5.28)
RDW: 12.7 % (ref 11.7–15.4)
RPR Ser Ql: NONREACTIVE
WBC: 12 10*3/uL — ABNORMAL HIGH (ref 3.4–10.8)

## 2022-06-30 ENCOUNTER — Ambulatory Visit (INDEPENDENT_AMBULATORY_CARE_PROVIDER_SITE_OTHER): Payer: BC Managed Care – PPO | Admitting: Obstetrics & Gynecology

## 2022-06-30 VITALS — BP 102/70 | Wt 188.0 lb

## 2022-06-30 DIAGNOSIS — Z3402 Encounter for supervision of normal first pregnancy, second trimester: Secondary | ICD-10-CM

## 2022-06-30 DIAGNOSIS — Z3A31 31 weeks gestation of pregnancy: Secondary | ICD-10-CM

## 2022-06-30 LAB — POCT URINALYSIS DIPSTICK OB
Glucose, UA: NEGATIVE
POC,PROTEIN,UA: NEGATIVE

## 2022-06-30 NOTE — Progress Notes (Signed)
ROB- no concerns 

## 2022-06-30 NOTE — Addendum Note (Signed)
Addended by: Donnetta Hail on: 06/30/2022 11:27 AM   Modules accepted: Orders

## 2022-06-30 NOTE — Progress Notes (Signed)
Subjective:    Janet Gilmore is a 31 y.o. female G1 being seen today for her obstetrical visit. She is at [redacted]w[redacted]d gestation. Patient reports no complaints. Fetal movement: normal.  Review of Systems:   Review of Systems   Objective:    BP 102/70   Wt 188 lb (85.3 kg)   LMP 11/24/2021 (Exact Date)   BMI 33.30 kg/m   Physical Exam  Exam  FHT:  130s BPM  Uterine Size: 31 cm  Presentation: unsure     Assessment:    Pregnancy:  G1P0000    Plan:    Patient Active Problem List   Diagnosis Date Noted  . Supervision of normal pregnancy 01/14/2022    Doing well - Routine care Come back in 2 weeks/prn sooner

## 2022-07-15 ENCOUNTER — Ambulatory Visit (INDEPENDENT_AMBULATORY_CARE_PROVIDER_SITE_OTHER): Payer: BC Managed Care – PPO | Admitting: Family Medicine

## 2022-07-15 VITALS — BP 118/70 | Wt 191.0 lb

## 2022-07-15 DIAGNOSIS — Z3403 Encounter for supervision of normal first pregnancy, third trimester: Secondary | ICD-10-CM

## 2022-07-15 DIAGNOSIS — K589 Irritable bowel syndrome without diarrhea: Secondary | ICD-10-CM

## 2022-07-15 NOTE — Progress Notes (Signed)
No vb. No lof.  

## 2022-07-15 NOTE — Progress Notes (Signed)
    PRENATAL VISIT NOTE  Subjective:  Janet Gilmore is a 31 y.o. G1P0000 at [redacted]w[redacted]d being seen today for ongoing prenatal care.  She is currently monitored for the following issues for this low-risk pregnancy and has Supervision of normal pregnancy and IBS (irritable bowel syndrome) on their problem list.  Patient reports no complaints.  Contractions: Not present. Vag. Bleeding: None.  Movement: Present. Denies leaking of fluid.   The following portions of the patient's history were reviewed and updated as appropriate: allergies, current medications, past family history, past medical history, past social history, past surgical history and problem list.   Objective:   Vitals:   07/15/22 0900  BP: 118/70  Weight: 191 lb (86.6 kg)    Fetal Status: Fetal Heart Rate (bpm): 130 Fundal Height: 32 cm Movement: Present     General:  Alert, oriented and cooperative. Patient is in no acute distress.  Skin: Skin is warm and dry. No rash noted.   Cardiovascular: Normal heart rate noted  Respiratory: Normal respiratory effort, no problems with respiration noted  Abdomen: Soft, gravid, appropriate for gestational age.  Pain/Pressure: Absent     Pelvic: Cervical exam deferred        Extremities: Normal range of motion.  Edema: None  Mental Status: Normal mood and affect. Normal behavior. Normal judgment and thought content.   Assessment and Plan:  Pregnancy: G1P0000 at [redacted]w[redacted]d  1. Encounter for supervision of normal first pregnancy in third trimester UTD  Having some minor back and hip pain. Counseled about this being normal Reviewed feeding, contraception, peds and circ plans and updated the box Reviewed where to go for labor/pregnancy concerns at hospital Patient will ask front desk for after hours number and about FMLA paperwork  Preterm labor symptoms and general obstetric precautions including but not limited to vaginal bleeding, contractions, leaking of fluid and fetal movement were  reviewed in detail with the patient. Please refer to After Visit Summary for other counseling recommendations.   Return in about 2 weeks (around 07/29/2022) for Routine prenatal care.  Future Appointments  Date Time Provider Department Center  07/29/2022  9:35 AM Allie Bossier, MD [facility] None    Federico Flake, MD

## 2022-07-29 ENCOUNTER — Ambulatory Visit (INDEPENDENT_AMBULATORY_CARE_PROVIDER_SITE_OTHER): Payer: BC Managed Care – PPO | Admitting: Obstetrics & Gynecology

## 2022-07-29 VITALS — BP 120/80 | Wt 194.0 lb

## 2022-07-29 DIAGNOSIS — Z3A35 35 weeks gestation of pregnancy: Secondary | ICD-10-CM

## 2022-07-29 DIAGNOSIS — Z369 Encounter for antenatal screening, unspecified: Secondary | ICD-10-CM

## 2022-07-29 DIAGNOSIS — Z3403 Encounter for supervision of normal first pregnancy, third trimester: Secondary | ICD-10-CM

## 2022-07-29 DIAGNOSIS — Z3685 Encounter for antenatal screening for Streptococcus B: Secondary | ICD-10-CM

## 2022-07-29 NOTE — Progress Notes (Signed)
   PRENATAL VISIT NOTE  Subjective:  Janet Gilmore is a 31 y.o. G1P0000 at [redacted]w[redacted]d being seen today for ongoing prenatal care.  She is currently monitored for the following issues for this low-risk pregnancy and has Supervision of normal pregnancy and IBS (irritable bowel syndrome) on their problem list.  Patient reports no complaints.  Contractions: Not present. Vag. Bleeding: None.  Movement: Present. Denies leaking of fluid.   The following portions of the patient's history were reviewed and updated as appropriate: allergies, current medications, past family history, past medical history, past social history, past surgical history and problem list.   Objective:   Vitals:   07/29/22 0937  BP: 120/80  Weight: 194 lb (88 kg)    Fetal Status:     Movement: Present     General:  Alert, oriented and cooperative. Patient is in no acute distress.  Skin: Skin is warm and dry. No rash noted.   Cardiovascular: Normal heart rate noted  Respiratory: Normal respiratory effort, no problems with respiration noted  Abdomen: Soft, gravid, appropriate for gestational age.  Pain/Pressure: Absent     Pelvic: Cervical exam deferred        Extremities: Normal range of motion.     Mental Status: Normal mood and affect. Normal behavior. Normal judgment and thought content.   Assessment and Plan:  Pregnancy: G1P0000 at [redacted]w[redacted]d 1. Encounter for supervision of normal first pregnancy in third trimester   2. [redacted] weeks gestation of pregnancy - check GBS today  Preterm labor symptoms and general obstetric precautions including but not limited to vaginal bleeding, contractions, leaking of fluid and fetal movement were reviewed in detail with the patient. Please refer to After Visit Summary for other counseling recommendations.   No follow-ups on file.  No future appointments.  Allie Bossier, MD

## 2022-08-02 LAB — CULTURE, BETA STREP (GROUP B ONLY): Strep Gp B Culture: NEGATIVE

## 2022-08-07 ENCOUNTER — Ambulatory Visit (INDEPENDENT_AMBULATORY_CARE_PROVIDER_SITE_OTHER): Payer: BC Managed Care – PPO | Admitting: Obstetrics & Gynecology

## 2022-08-07 ENCOUNTER — Encounter: Payer: Self-pay | Admitting: Obstetrics & Gynecology

## 2022-08-07 VITALS — BP 118/82 | HR 86 | Wt 196.4 lb

## 2022-08-07 DIAGNOSIS — Z3403 Encounter for supervision of normal first pregnancy, third trimester: Secondary | ICD-10-CM

## 2022-08-07 LAB — POCT URINALYSIS DIPSTICK OB
Blood, UA: NEGATIVE
POC,PROTEIN,UA: NEGATIVE
Spec Grav, UA: 1.02 (ref 1.010–1.025)
Urobilinogen, UA: 0.2 E.U./dL
pH, UA: 7 (ref 5.0–8.0)

## 2022-08-07 NOTE — Progress Notes (Signed)
Subjective:    Janet Gilmore is a 31 y.o. female being seen today for her obstetrical visit. She is at [redacted]w[redacted]d gestation. Patient reports no bleeding, no contractions, no cramping, and no leaking. Fetal movement: normal.  Menstrual History: OB History     Gravida  1   Para  0   Term  0   Preterm  0   AB  0   Living  0      SAB  0   IAB  0   Ectopic  0   Multiple  0   Live Births  0            Patient's last menstrual period was 11/24/2021 (exact date).    The following portions of the patient's history were reviewed and updated as appropriate: allergies, current medications, past family history, past medical history, past social history, past surgical history, and problem list.  Review of Systems A comprehensive review of systems was negative.   Objective:    BP 118/82   Pulse 86   Wt 196 lb 6.4 oz (89.1 kg)   LMP 11/24/2021 (Exact Date)   BMI 34.79 kg/m  FHT: 141 BPM  Uterine Size: 36 cm  Presentations: cephalic  Pelvic Exam:              Dilation: deferred       Effacement: deferred             Station:  deferred    Consistency: deferred            Position: deferred     Assessment:    Pregnancy 36 and 4/7 weeks   Plan:    Beta strep culture: last week Counseling:  labor precautions  given Fetal testing: discussed Follow up in 1 Week.  3rd tri cxs done last wk Labor precau given

## 2022-08-11 ENCOUNTER — Ambulatory Visit (INDEPENDENT_AMBULATORY_CARE_PROVIDER_SITE_OTHER): Payer: BC Managed Care – PPO | Admitting: Obstetrics & Gynecology

## 2022-08-11 VITALS — BP 122/70 | Wt 200.0 lb

## 2022-08-11 DIAGNOSIS — Z3A37 37 weeks gestation of pregnancy: Secondary | ICD-10-CM

## 2022-08-11 NOTE — Progress Notes (Signed)
No vb. No lof.  

## 2022-08-11 NOTE — Progress Notes (Signed)
   PRENATAL VISIT NOTE  Subjective:  Janet Gilmore is a 31 y.o. G1P0000 at [redacted]w[redacted]d being seen today for ongoing prenatal care.  She is currently monitored for the following issues for this low-risk pregnancy and has Supervision of normal pregnancy and IBS (irritable bowel syndrome) on their problem list.  Patient reports no complaints.  Contractions: Not present. Vag. Bleeding: None.  Movement: Present. Denies leaking of fluid.   The following portions of the patient's history were reviewed and updated as appropriate: allergies, current medications, past family history, past medical history, past social history, past surgical history and problem list.   Objective:   Vitals:   08/11/22 1439  BP: 122/70  Weight: 200 lb (90.7 kg)    Fetal Status: Fetal Heart Rate (bpm): 148 Fundal Height: 37 cm Movement: Present  Presentation: Vertex  General:  Alert, oriented and cooperative. Patient is in no acute distress.  Skin: Skin is warm and dry. No rash noted.   Cardiovascular: Normal heart rate noted  Respiratory: Normal respiratory effort, no problems with respiration noted  Abdomen: Soft, gravid, appropriate for gestational age.  Pain/Pressure: Absent     Pelvic: Cervical exam deferred        Extremities: Normal range of motion.     Mental Status: Normal mood and affect. Normal behavior. Normal judgment and thought content.   Assessment and Plan:  Pregnancy: G1P0000 at [redacted]w[redacted]d There are no diagnoses linked to this encounter. Preterm labor symptoms and general obstetric precautions including but not limited to vaginal bleeding, contractions, leaking of fluid and fetal movement were reviewed in detail with the patient. Please refer to After Visit Summary for other counseling recommendations.   No follow-ups on file.  No future appointments.  Allie Bossier, MD

## 2022-08-18 ENCOUNTER — Ambulatory Visit (INDEPENDENT_AMBULATORY_CARE_PROVIDER_SITE_OTHER): Payer: BC Managed Care – PPO | Admitting: Licensed Practical Nurse

## 2022-08-18 VITALS — BP 104/70 | Wt 201.0 lb

## 2022-08-18 DIAGNOSIS — Z3400 Encounter for supervision of normal first pregnancy, unspecified trimester: Secondary | ICD-10-CM

## 2022-08-18 DIAGNOSIS — Z3A38 38 weeks gestation of pregnancy: Secondary | ICD-10-CM

## 2022-08-18 NOTE — Progress Notes (Signed)
ROB- no concerns 

## 2022-08-18 NOTE — Progress Notes (Signed)
Routine Prenatal Care Visit  Subjective  Janet Gilmore is a 31 y.o. G1P0000 at [redacted]w[redacted]d being seen today for ongoing prenatal care.  She is currently monitored for the following issues for this low-risk pregnancy and has Supervision of normal pregnancy and IBS (irritable bowel syndrome) on their problem list.  ----------------------------------------------------------------------------------- Patient reports no complaints.  Feeling great, sleeping well. Completed CBE.  Her husband gets 6 weeks off and they have family near by  Contractions: Not present. Vag. Bleeding: None.  Movement: Present. Leaking Fluid denies.  ----------------------------------------------------------------------------------- The following portions of the patient's history were reviewed and updated as appropriate: allergies, current medications, past family history, past medical history, past social history, past surgical history and problem list. Problem list updated.  Objective  Blood pressure 104/70, weight 201 lb (91.2 kg), last menstrual period 11/24/2021. Pregravid weight 161 lb (73 kg) Total Weight Gain 40 lb (18.1 kg) Urinalysis: Urine Protein    Urine Glucose    Fetal Status: Fetal Heart Rate (bpm): 130 Fundal Height: 38 cm Movement: Present     General:  Alert, oriented and cooperative. Patient is in no acute distress.  Skin: Skin is warm and dry. No rash noted.   Cardiovascular: Normal heart rate noted  Respiratory: Normal respiratory effort, no problems with respiration noted  Abdomen: Soft, gravid, appropriate for gestational age. Pain/Pressure: Absent     Pelvic:  Cervical exam deferred        Extremities: Normal range of motion.  Edema: Trace  Mental Status: Normal mood and affect. Normal behavior. Normal judgment and thought content.   Assessment   31 y.o. G1P0000 at [redacted]w[redacted]d by  08/31/2022, by Last Menstrual Period presenting for routine prenatal visit  Plan   [email or web address]  Problems (from 01/14/22 to  present)     Problem Noted Resolved   Supervision of normal pregnancy 01/14/2022 by Tresea Mall, CNM No   Overview Addendum 07/15/2022  9:56 AM by Federico Flake, MD    6/2 Nursing Staff Provider  Office Location  [facility] Dating  EDD by LMP c/w 7w u/s  Language  English Anatomy US    Flu Vaccine  10/31/21 Genetic Screen  NIPS:   TDaP vaccine   06/16/2022 Hgb A1C or  GTT Early : NA Third trimester :   Covid    LAB RESULTS   Rhogam  NA Blood Type O/Positive/-- (01/24 1541)   Feeding Plan Breast- plans to pump, has ordered pump Antibody Negative (01/24 1541)  Contraception POP Rubella 1.85 (01/24 1541)  Circumcision Desires, inpatient RPR Non Reactive (01/24 1541)   Pediatrician  [facility] HBsAg Negative (01/24 1541)   Support Person Harrold Donath HIV Non Reactive (01/24 1541)  Prenatal Classes  Varicella     GBS  (For PCN allergy, check sensitivities)   BTL Consent     VBAC Consent NA Pap  2020 negative 01/14/22:       Hgb Electro    Pelvis Tested NA CF      SMA         Hx IBS            Term labor symptoms and general obstetric precautions including but not limited to vaginal bleeding, contractions, leaking of fluid and fetal movement were reviewed in detail with the patient. Please refer to After Visit Summary for other counseling recommendations.   Return in about 1 week (around 08/25/2022) for ROB.  Carie Caddy, CNM  [facility], [facility]  08/18/22  4:26 PM

## 2022-08-23 IMAGING — US US OB COMP +14 WK
1 of 2 series · 13 of 28 positions shown · non-contrast
Comparison: none

CLINICAL DATA: Fetal anatomy

EXAM:
OBSTETRICAL ULTRASOUND >14 WKS AND BIOPHYSICAL PROFILE

[Series 1: us ob comp + 14 wk · 13 of 92 slices shown]
[im 4/92]
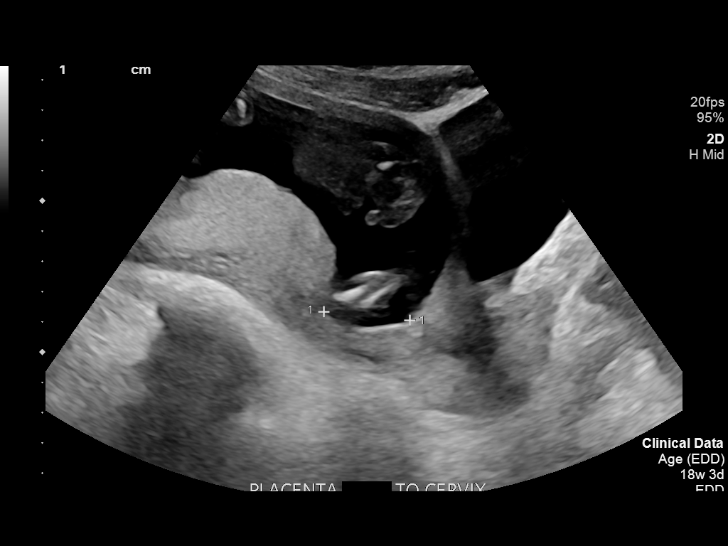
[im 11/92]
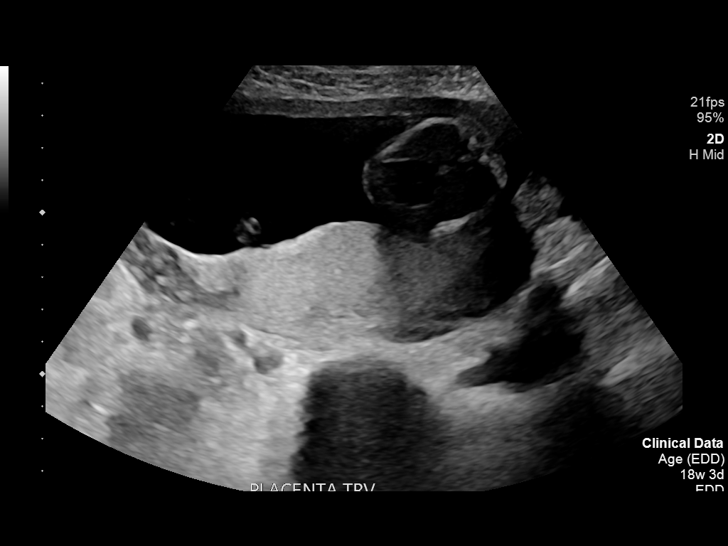
[im 18/92]
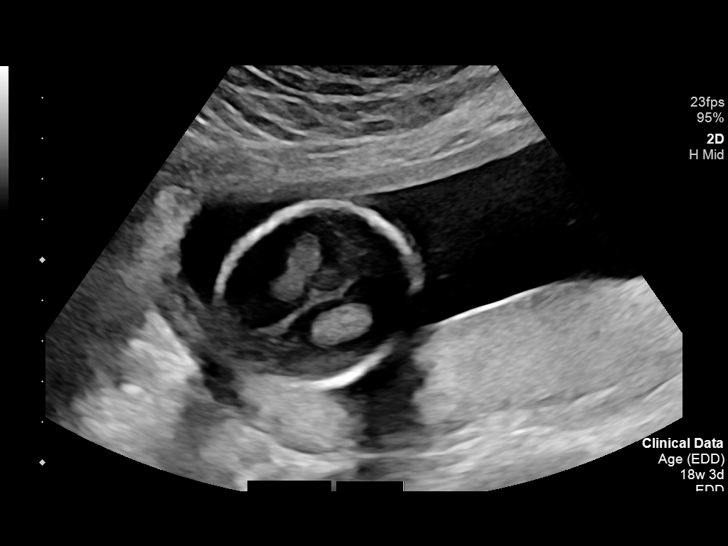
[im 25/92]
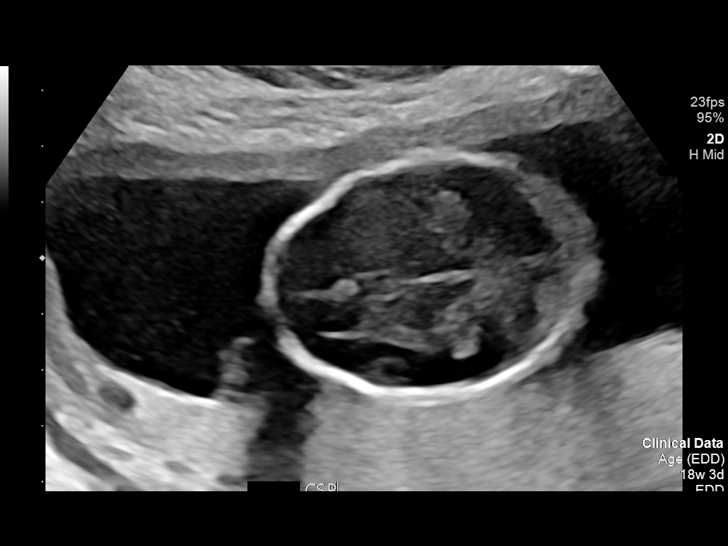
[im 32/92]
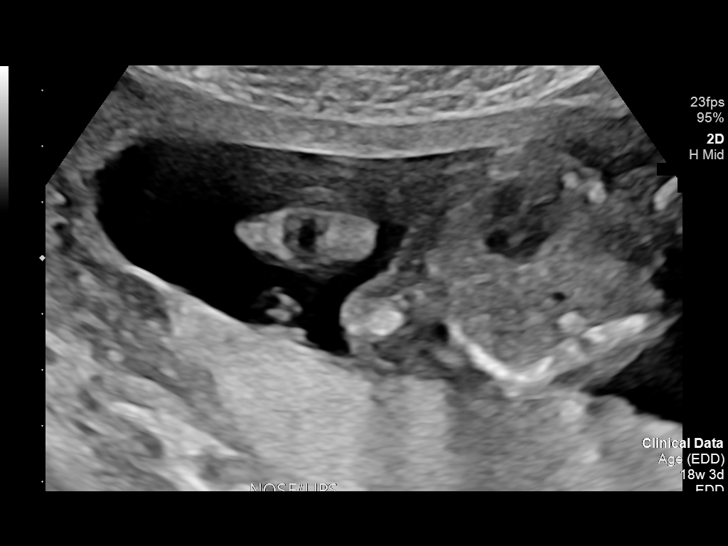
[im 39/92]
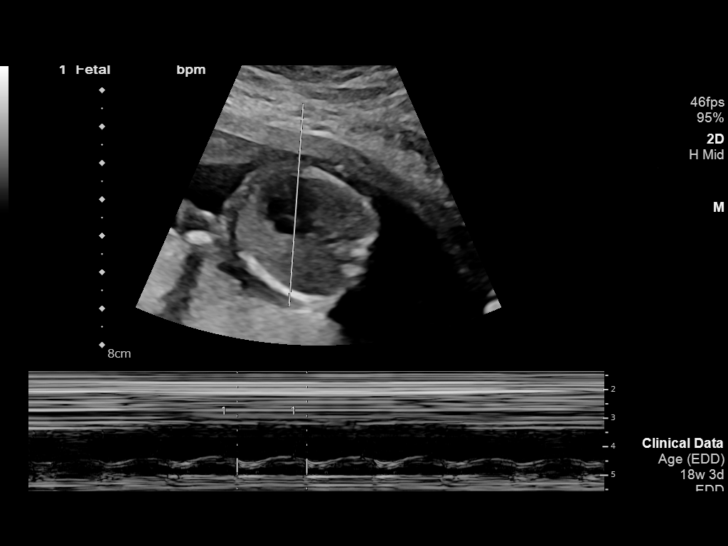
[im 50/92]
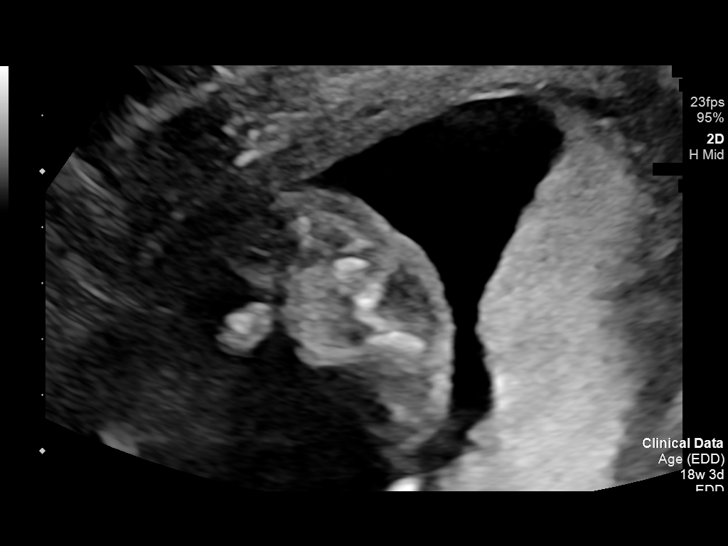
[im 57/92]
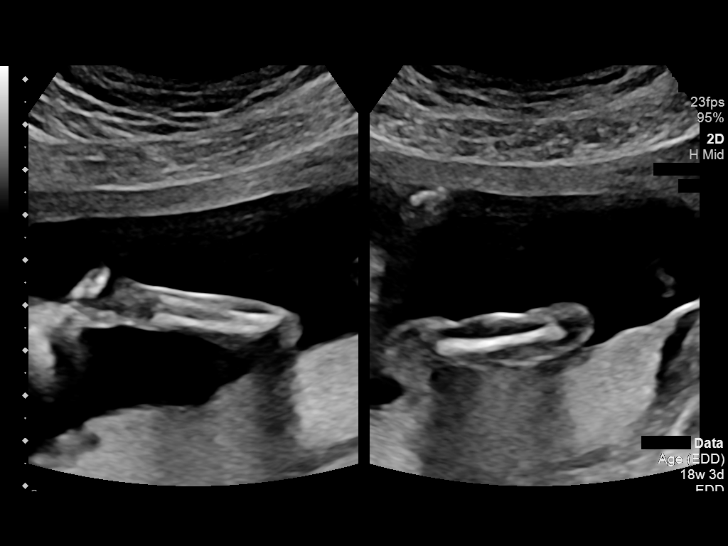
[im 64/92]
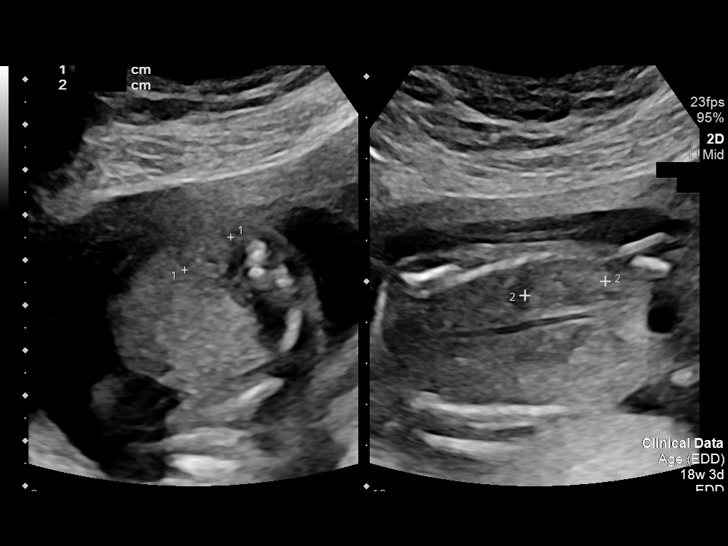
[im 71/92]
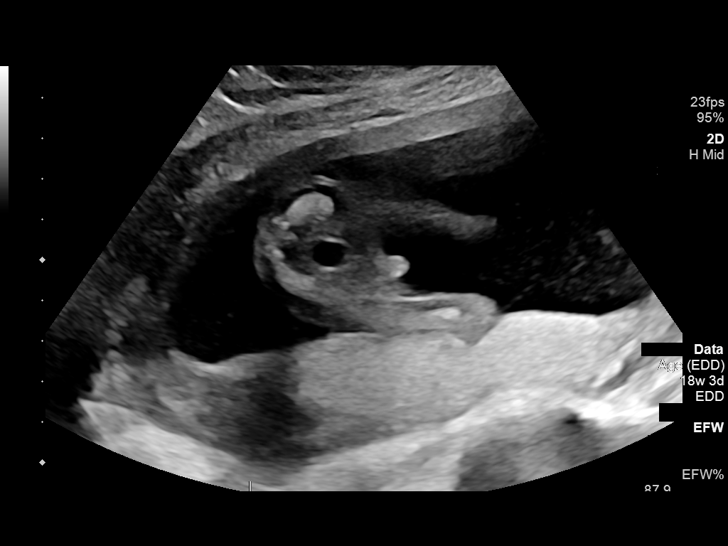
[im 78/92]
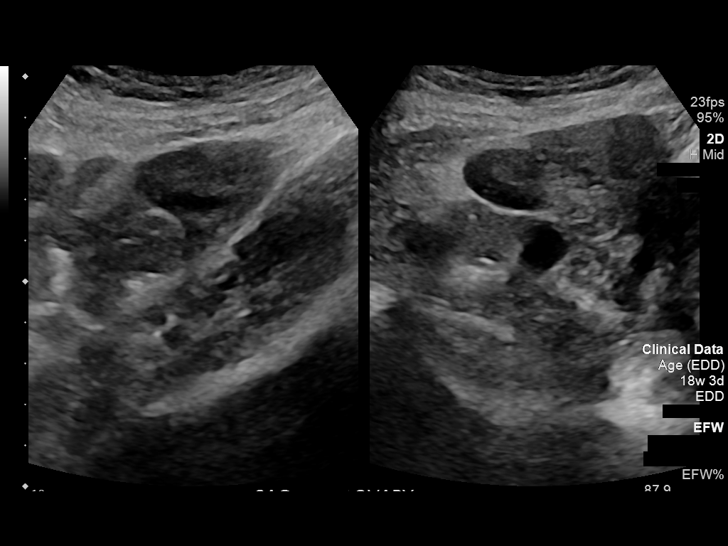
[im 85/92]
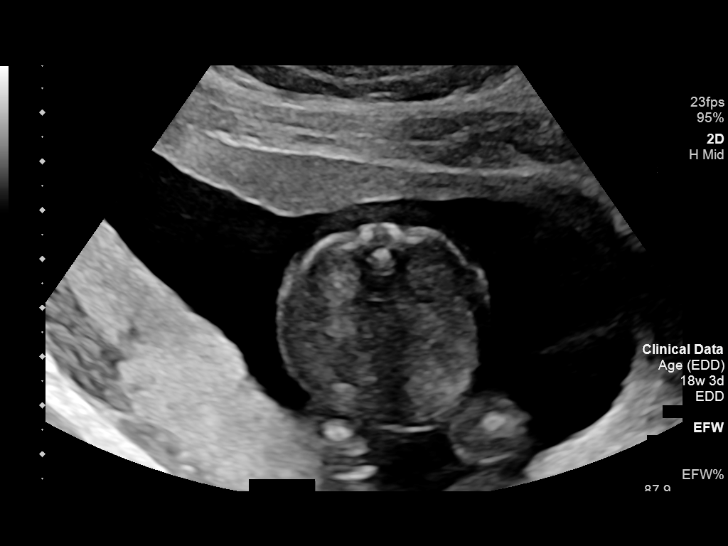
[im 92/92]
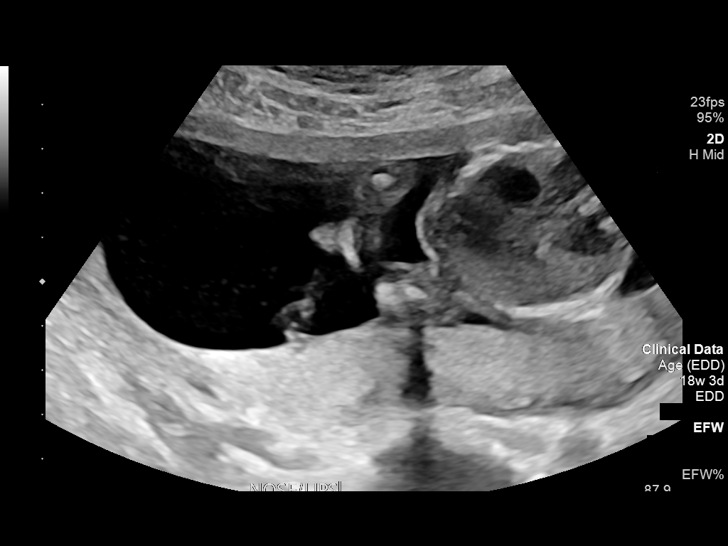

[13 of 28 positions shown; findings below may reference images not displayed]

FINDINGS: Number of Fetuses: 1

Heart Rate:  147 bpm

Movement: Yes

Presentation: Breech

Previa: No

Placental Location: Posterior

Amniotic Fluid (Subjective): Normal

Amniotic Fluid (Objective):

Vertical pocket 3.9cm

FETAL BIOMETRY

BPD:  4.2cm 18w 5d

HC:    16.2cm 19w 0d

AC:   14.3cm 19w 4d

FL:   2.9cm 18w 6d

Current Mean GA: 18w 6d US EDC: 08/28/2022

Assigned GA: 18w 3d Assigned EDC: 08/31/2022

Estimated Fetal Weight:  279g 88%ile

FETAL ANATOMY

Lateral Ventricles: Appears normal

Thalami/CSP: Appears normal

Posterior Fossa: Appears normal

Nuchal Region: Appears normal    NFT= not measured

Upper Lip: Appears normal

Spine: Appears normal

4 Chamber Heart on Left: Appears normal

LVOT: Appears normal

RVOT: Appears normal

Stomach on Left: Appears normal

3 Vessel Cord: Appears normal

Cord Insertion site: Appears normal

Kidneys: Appears normal

Bladder: Appears normal

Extremities: Appears normal

Sex: Male

Technical Limitations: Fetal position

Maternal Findings:

Cervix:  4.4 cm
IMPRESSION: 1. Single live intrauterine gestation with approximate gestational
age of 18 weeks and 6 days, EDC based on today's sonogram is
08/28/2022.
2. Profile view was not seen due to fetal positioning.

## 2022-08-26 ENCOUNTER — Ambulatory Visit (INDEPENDENT_AMBULATORY_CARE_PROVIDER_SITE_OTHER): Payer: BC Managed Care – PPO | Admitting: Obstetrics & Gynecology

## 2022-08-26 VITALS — BP 126/80 | Wt 203.2 lb

## 2022-08-26 DIAGNOSIS — Z3400 Encounter for supervision of normal first pregnancy, unspecified trimester: Secondary | ICD-10-CM

## 2022-08-26 DIAGNOSIS — Z3A39 39 weeks gestation of pregnancy: Secondary | ICD-10-CM

## 2022-08-26 LAB — POCT URINALYSIS DIPSTICK OB
Glucose, UA: NEGATIVE
POC,PROTEIN,UA: NEGATIVE

## 2022-08-29 ENCOUNTER — Encounter: Payer: BC Managed Care – PPO | Admitting: Obstetrics

## 2022-08-31 ENCOUNTER — Encounter: Payer: Self-pay | Admitting: Obstetrics & Gynecology

## 2022-09-01 ENCOUNTER — Encounter: Payer: Self-pay | Admitting: Obstetrics and Gynecology

## 2022-09-01 ENCOUNTER — Ambulatory Visit (INDEPENDENT_AMBULATORY_CARE_PROVIDER_SITE_OTHER): Payer: BC Managed Care – PPO | Admitting: Licensed Practical Nurse

## 2022-09-01 ENCOUNTER — Observation Stay (HOSPITAL_BASED_OUTPATIENT_CLINIC_OR_DEPARTMENT_OTHER)
Admission: EM | Admit: 2022-09-01 | Discharge: 2022-09-01 | Disposition: A | Discharge status: Home / Self Care | Payer: BC Managed Care – PPO | Source: Home / Self Care | Admitting: Obstetrics and Gynecology

## 2022-09-01 VITALS — BP 122/80 | Wt 203.0 lb

## 2022-09-01 DIAGNOSIS — Z79899 Other long term (current) drug therapy: Secondary | ICD-10-CM | POA: Insufficient documentation

## 2022-09-01 DIAGNOSIS — O480 Post-term pregnancy: Secondary | ICD-10-CM | POA: Insufficient documentation

## 2022-09-01 DIAGNOSIS — Z3A40 40 weeks gestation of pregnancy: Secondary | ICD-10-CM

## 2022-09-01 DIAGNOSIS — Z3400 Encounter for supervision of normal first pregnancy, unspecified trimester: Secondary | ICD-10-CM

## 2022-09-01 DIAGNOSIS — O368330 Maternal care for abnormalities of the fetal heart rate or rhythm, third trimester, not applicable or unspecified: Secondary | ICD-10-CM | POA: Insufficient documentation

## 2022-09-01 DIAGNOSIS — O288 Other abnormal findings on antenatal screening of mother: Secondary | ICD-10-CM | POA: Diagnosis present

## 2022-09-01 LAB — POCT URINALYSIS DIPSTICK OB
Glucose, UA: NEGATIVE
POC,PROTEIN,UA: NEGATIVE

## 2022-09-01 NOTE — Discharge Summary (Signed)
   Please see Final Progress Note  Mirna Mires, CNM  09/01/2022 11:07 AM

## 2022-09-01 NOTE — OB Triage Note (Signed)
Patient sent from [facility] office for extended monitoring. She has no specific complaints. Reports positive fetal movement, no leaking of fluid and no vaginal bleeding. She reports feeling some mild cramps but nothing painful in the way of contractions. Monitors applied continue to monitor.

## 2022-09-01 NOTE — Final Progress Note (Signed)
Final Progress Note  Patient ID: Janet Gilmore MRN: [id number] DOB/AGE: 01/05/1991 31 y.o.  Admit date: 09/01/2022 Admitting provider: Linzie Collin, MD Discharge date: 09/01/2022   Admission Diagnoses: For NST. Sent from the [place] office for questionable decel on NST  Discharge Diagnoses:  Principal Problem:   NST (non-stress test) with decelerations  Raeactive NST, category 1  History of Present Illness: The patient is a 31 y.o. female G1P0000 at [redacted]w[redacted]d who presents for an NST. She was just seen at [facility] for a ROB at 40 weeks 1 day. During her NST, there was a questionable decel heard, but not traced per the CNM.Marland KitchenThe patient was found to be 3 cms dilated/70% effaced. She was sent to the Labor and Delivery unit for some fetal monitoring. She denies feeling contractions, and her baby has been moving well.Her husband is present and supportive.  Past Medical History:  Diagnosis Date   Allergic rhinitis    IBS (irritable bowel syndrome)    Lactose intolerance    symptamatic but not diagnosed   Rash     Past Surgical History:  Procedure Laterality Date   MOUTH SURGERY     x 3   TONSILLECTOMY     WISDOM TOOTH EXTRACTION      No current facility-administered medications on file prior to encounter.   Current Outpatient Medications on File Prior to Encounter  Medication Sig Dispense Refill   glycopyrrolate (ROBINUL) 1 MG tablet TAKE 1 TABLET(1 MG) BY MOUTH TWICE DAILY 60 tablet 11   levocetirizine (XYZAL) 5 MG tablet Take 5 mg by mouth daily.     NAFTIN 2 % GEL Apply topically. (Patient not taking: Reported on 08/18/2022)     Prenatal Vit-Fe Fumarate-FA (PRENATAL MULTIVITAMIN) TABS tablet Take 1 tablet by mouth daily at 12 noon.     triamcinolone cream (KENALOG) 0.1 % SMARTSIG:1 Application Topical 2-3 Times Daily (Patient not taking: Reported on 09/01/2022)     VITAMIN D PO Take by mouth.      Allergies  Allergen Reactions   Doxylamine Other (See Comments)    Adhesive [Tape]    Other     Nickle, gold, stainless steel   Sulfa Antibiotics    Nickel Rash    Social History   Socioeconomic History   Marital status: Married    Spouse name: Not on file   Number of children: 0   Years of education: Not on file   Highest education level: Not on file  Occupational History   Occupation: Runner, broadcasting/film/video  Tobacco Use   Smoking status: Never   Smokeless tobacco: Never  Vaping Use   Vaping Use: Never used  Substance and Sexual Activity   Alcohol use: Not Currently    Comment: social   Drug use: No   Sexual activity: Yes    Partners: Male  Other Topics Concern   Not on file  Social History Narrative   Not on file   Social Determinants of Health   Financial Resource Strain: Not on file  Food Insecurity: Not on file  Transportation Needs: Not on file  Physical Activity: Insufficiently Active (12/30/2017)   Exercise Vital Sign    Days of Exercise per Week: 2 days    Minutes of Exercise per Session: 40 min  Stress: Stress Concern Present (12/30/2017)   [employer] of Occupational Health - Occupational Stress Questionnaire    Feeling of Stress : To some extent  Social Connections: Moderately Integrated (12/30/2017)   Social Connection and Isolation  Panel [NHANES]    Frequency of Communication with Friends and Family: More than three times a week    Frequency of Social Gatherings with Friends and Family: Once a week    Attends Religious Services: More than 4 times per year    Active Member of [employer] or Organizations: Yes    Attends Engineer, structural: More than 4 times per year    Marital Status: Never married  Intimate Partner Violence: Not At Risk (12/30/2017)   Humiliation, Afraid, Rape, and Kick questionnaire    Fear of Current or Ex-Partner: No    Emotionally Abused: No    Physically Abused: No    Sexually Abused: No    Family History  Problem Relation Age of Onset   Non-Hodgkin's lymphoma Father    Colon polyps Father     Lymphoma Father    Breast cancer Paternal Grandmother 24   Colon polyps Mother    Irritable bowel syndrome Mother    GER disease Brother    Dementia Maternal Grandmother    Heart disease Maternal Grandfather    Diabetes Paternal Grandfather    Diverticulitis Paternal Grandfather    Breast cancer Paternal Aunt 54       "gene neg"     ROS   Physical Exam: BP 134/74   Pulse 87   LMP 11/24/2021 (Exact Date)   Breastfeeding Unknown   OBGyn Exam  Consults: None  Significant Findings/ Diagnostic Studies: NST only.  Procedures: EFM NST Baseline FHR: 145 beats/min Variability: moderate Accelerations: present Decelerations: present initially, but with position change, they were eliminated Tocometry: Initially appeared to be having regular contractions, although the patient does not feel them.  Interpretation:  INDICATIONS: questionable dips in FHTS during office NST. RESULTS:  A NST procedure was performed with FHR monitoring and a normal baseline established, appropriate time of 20-40 minutes of evaluation, and accels >2 seen w 15x15 characteristics.  Results show a REACTIVE NST.    Hospital Course: The patient was admitted to Labor and Delivery Triage for observation. She was monitored and with a position change demonstrated a reactive NST. She was swept in the office just before coming to Labor and Delivery where she was noted to have contractions that eventually spaced apart. EFM is more challenging due to her high BMI. Once she was poistioned onto her side, the FHTs showed excellent variability with accels. She is discharged home with plans for IOL at 41 weeks should she not labor spontaneously.  Discharge Condition: good  Disposition: Discharge disposition: 01-Home or Self Care       Diet: Regular diet  Discharge Activity: Activity as tolerated  Discharge Instructions     Fetal Kick Count:  Lie on our left side for one hour after a meal, and count the number of  times your baby kicks.  If it is less than 5 times, get up, move around and drink some juice.  Repeat the test 30 minutes later.  If it is still less than 5 kicks in an hour, notify your doctor.   Complete by: As directed    LABOR:  When conractions begin, you should start to time them from the beginning of one contraction to the beginning  of the next.  When contractions are 5 - 10 minutes apart or less and have been regular for at least an hour, you should call your health care provider.   Complete by: As directed    Notify physician for bleeding from the vagina  Complete by: As directed    Notify physician for blurring of vision or spots before the eyes   Complete by: As directed    Notify physician for chills or fever   Complete by: As directed    Notify physician for fainting spells, "black outs" or loss of consciousness   Complete by: As directed    Notify physician for increase in vaginal discharge   Complete by: As directed    Notify physician for leaking of fluid   Complete by: As directed    Notify physician for pain or burning when urinating   Complete by: As directed    Notify physician for pelvic pressure (sudden increase)   Complete by: As directed    Notify physician for severe or continued nausea or vomiting   Complete by: As directed    Notify physician for sudden gushing of fluid from the vagina (with or without continued leaking)   Complete by: As directed    Notify physician for sudden, constant, or occasional abdominal pain   Complete by: As directed    Notify physician if baby moving less than usual   Complete by: As directed       Allergies as of 09/01/2022       Reactions   Doxylamine Other (See Comments)   Adhesive [tape]    Other    Nickle, gold, stainless steel   Sulfa Antibiotics    Nickel Rash        Medication List     TAKE these medications    glycopyrrolate 1 MG tablet Commonly known as: ROBINUL TAKE 1 TABLET(1 MG) BY MOUTH TWICE  DAILY   levocetirizine 5 MG tablet Commonly known as: XYZAL Take 5 mg by mouth daily.   Naftin 2 % Gel Generic drug: Naftifine HCl Apply topically.   prenatal multivitamin Tabs tablet Take 1 tablet by mouth daily at 12 noon.   triamcinolone cream 0.1 % Commonly known as: KENALOG SMARTSIG:1 Application Topical 2-3 Times Daily   VITAMIN D PO Take by mouth.      45  Total time spent taking care of this patient: 45 minutes providing direct patient care, review of records and documentation.  Signed: Mirna Mires, CNM  09/01/2022, 11:08 AM

## 2022-09-01 NOTE — Progress Notes (Signed)
Routine Prenatal Care Visit  Subjective  Janet Gilmore is a 31 y.o. G1P0000 at [redacted]w[redacted]d being seen today for ongoing prenatal care.  She is currently monitored for the following issues for this low-risk pregnancy and has Supervision of normal pregnancy; IBS (irritable bowel syndrome); and NST (non-stress test) with decelerations on their problem list.  ----------------------------------------------------------------------------------- Patient reports no complaints.  Doing well, starting to feel a few contractions. Fetal heart tone's in lower left quadrant 90's, maternal pulse palpated about the same rate, then auscultated in lower right quadrant rate 115-120's, fetal movement palpated.  Given hearttones usually 130's-140's and concern for decel, instructed pt to go to [facility] for a NST, anticipatory  guidance given.  Contractions: Irregular. Vag. Bleeding: None.  Movement: Present. Leaking Fluid denies.  ----------------------------------------------------------------------------------- The following portions of the patient's history were reviewed and updated as appropriate: allergies, current medications, past family history, past medical history, past social history, past surgical history and problem list. Problem list updated.  Objective  Blood pressure 122/80, weight 203 lb (92.1 kg), last menstrual period 11/24/2021, unknown if currently breastfeeding. Pregravid weight 161 lb (73 kg) Total Weight Gain 42 lb (19.1 kg) Urinalysis: Urine Protein Negative  Urine Glucose Negative  Fetal Status: Fetal Heart Rate (bpm): 115 Fundal Height: 40 cm Movement: Present  Presentation: Vertex  General:  Alert, oriented and cooperative. Patient is in no acute distress.  Skin: Skin is warm and dry. No rash noted.   Cardiovascular: Normal heart rate noted  Respiratory: Normal respiratory effort, no problems with respiration noted  Abdomen: Soft, gravid, appropriate for gestational age. Pain/Pressure: Present      Pelvic:  Cervical exam performed Dilation: 3 Effacement (%): 70 Station: 0, swept   Extremities: Normal range of motion.  Edema: Mild pitting, slight indentation  Mental Status: Normal mood and affect. Normal behavior. Normal judgment and thought content.   Assessment   31 y.o. G1P0000 at [redacted]w[redacted]d by  08/31/2022, by Last Menstrual Period presenting for routine prenatal visit  Plan   pregnancy1  Problems (from 01/14/22 to 09/01/22)     Problem Noted Resolved   Supervision of normal pregnancy 01/14/2022 by Tresea Mall, CNM No   Overview Addendum 07/15/2022  9:56 AM by Federico Flake, MD    6/2 Nursing Staff Provider  Office Location  [facility] Dating  EDD by LMP c/w 7w u/s  Language  English Anatomy US    Flu Vaccine  10/31/21 Genetic Screen  NIPS:   TDaP vaccine   06/16/2022 Hgb A1C or  GTT Early : NA Third trimester :   Covid    LAB RESULTS   Rhogam  NA Blood Type O/Positive/-- (01/24 1541)   Feeding Plan Breast- plans to pump, has ordered pump Antibody Negative (01/24 1541)  Contraception POP Rubella 1.85 (01/24 1541)  Circumcision Desires, inpatient RPR Non Reactive (01/24 1541)   Pediatrician  [facility] HBsAg Negative (01/24 1541)   Support Person Harrold Donath HIV Non Reactive (01/24 1541)  Prenatal Classes  Varicella     GBS  (For PCN allergy, check sensitivities)   BTL Consent     VBAC Consent NA Pap  2020 negative 01/14/22:       Hgb Electro    Pelvis Tested NA CF      SMA         Hx IBS            Term labor symptoms and general obstetric precautions including but not limited to vaginal bleeding, contractions, leaking of  fluid and fetal movement were reviewed in detail with the patient. Please refer to After Visit Summary for other counseling recommendations.   Instructed to go directly to [facility], will make plan based on NST  Carie Caddy, CNM  [facility], [facility]  09/01/22  11:14 AM

## 2022-09-02 ENCOUNTER — Inpatient Hospital Stay: Payer: BC Managed Care – PPO | Admitting: Anesthesiology

## 2022-09-02 ENCOUNTER — Other Ambulatory Visit: Payer: Self-pay

## 2022-09-02 ENCOUNTER — Inpatient Hospital Stay
Admission: EM | Admit: 2022-09-02 | Discharge: 2022-09-03 | DRG: 768 | Disposition: A | Discharge status: Home / Self Care | Payer: BC Managed Care – PPO | Attending: Certified Nurse Midwife | Admitting: Certified Nurse Midwife

## 2022-09-02 DIAGNOSIS — O9081 Anemia of the puerperium: Secondary | ICD-10-CM | POA: Diagnosis not present

## 2022-09-02 DIAGNOSIS — O480 Post-term pregnancy: Principal | ICD-10-CM | POA: Diagnosis present

## 2022-09-02 DIAGNOSIS — D62 Acute posthemorrhagic anemia: Secondary | ICD-10-CM | POA: Diagnosis not present

## 2022-09-02 DIAGNOSIS — Z3A40 40 weeks gestation of pregnancy: Secondary | ICD-10-CM

## 2022-09-02 HISTORY — DX: Other specified health status: Z78.9

## 2022-09-02 LAB — TYPE AND SCREEN
ABO/RH(D): O POS
Antibody Screen: NEGATIVE

## 2022-09-02 LAB — CBC
HCT: 38 % (ref 36.0–46.0)
Hemoglobin: 12.7 g/dL (ref 12.0–15.0)
MCH: 28 pg (ref 26.0–34.0)
MCHC: 33.4 g/dL (ref 30.0–36.0)
MCV: 83.7 fL (ref 80.0–100.0)
Platelets: 300 10*3/uL (ref 150–400)
RBC: 4.54 MIL/uL (ref 3.87–5.11)
RDW: 13.5 % (ref 11.5–15.5)
WBC: 18.3 10*3/uL — ABNORMAL HIGH (ref 4.0–10.5)
nRBC: 0 % (ref 0.0–0.2)

## 2022-09-02 LAB — ABO/RH: ABO/RH(D): O POS

## 2022-09-02 MED ORDER — CEFAZOLIN SODIUM-DEXTROSE 2-4 GM/100ML-% IV SOLN
INTRAVENOUS | Status: AC
  Filled 2022-09-02: qty 100

## 2022-09-02 MED ORDER — OXYTOCIN-SODIUM CHLORIDE 30-0.9 UT/500ML-% IV SOLN
2.5000 [IU]/h | INTRAVENOUS | Status: DC
  Administered 2022-09-02: 2.5 [IU]/h via INTRAVENOUS
  Filled 2022-09-02: qty 500

## 2022-09-02 MED ORDER — OXYTOCIN BOLUS FROM INFUSION
333.0000 mL | Freq: Once | INTRAVENOUS | Status: AC
  Administered 2022-09-02: 333 mL via INTRAVENOUS

## 2022-09-02 MED ORDER — ACETAMINOPHEN 325 MG PO TABS
650.0000 mg | ORAL_TABLET | ORAL | Status: DC | PRN
  Administered 2022-09-02: 650 mg via ORAL
  Filled 2022-09-02: qty 2

## 2022-09-02 MED ORDER — SODIUM CHLORIDE 0.9 % IV SOLN
INTRAVENOUS | Status: AC
  Filled 2022-09-02: qty 5

## 2022-09-02 MED ORDER — LIDOCAINE HCL (PF) 1 % IJ SOLN
INTRAMUSCULAR | Status: AC
  Administered 2022-09-02: 30 mL via SUBCUTANEOUS
  Filled 2022-09-02: qty 30

## 2022-09-02 MED ORDER — OXYCODONE-ACETAMINOPHEN 5-325 MG PO TABS: 2.0000 | ORAL_TABLET | ORAL | Status: DC | PRN

## 2022-09-02 MED ORDER — LACTATED RINGERS IV SOLN
500.0000 mL | Freq: Once | INTRAVENOUS | Status: AC
  Administered 2022-09-02: 500 mL via INTRAVENOUS

## 2022-09-02 MED ORDER — IBUPROFEN 600 MG PO TABS
600.0000 mg | ORAL_TABLET | Freq: Four times a day (QID) | ORAL | Status: DC
  Administered 2022-09-02 – 2022-09-03 (×2): 600 mg via ORAL
  Filled 2022-09-02 (×2): qty 1

## 2022-09-02 MED ORDER — SIMETHICONE 80 MG PO CHEW: 80.0000 mg | CHEWABLE_TABLET | ORAL | Status: DC | PRN

## 2022-09-02 MED ORDER — TETANUS-DIPHTH-ACELL PERTUSSIS 5-2.5-18.5 LF-MCG/0.5 IM SUSY: 0.5000 mL | PREFILLED_SYRINGE | Freq: Once | INTRAMUSCULAR | Status: DC

## 2022-09-02 MED ORDER — DIPHENHYDRAMINE HCL 50 MG/ML IJ SOLN: 12.5000 mg | INTRAMUSCULAR | Status: DC | PRN

## 2022-09-02 MED ORDER — OXYTOCIN-SODIUM CHLORIDE 30-0.9 UT/500ML-% IV SOLN: 1.0000 m[IU]/min | INTRAVENOUS | Status: DC

## 2022-09-02 MED ORDER — SODIUM CHLORIDE 0.9 % IV SOLN
INTRAVENOUS | Status: DC | PRN
  Administered 2022-09-02: 5 mL via EPIDURAL
  Administered 2022-09-02: 3 mL via EPIDURAL

## 2022-09-02 MED ORDER — LIDOCAINE HCL (PF) 1 % IJ SOLN: 30.0000 mL | INTRAMUSCULAR | Status: AC | PRN

## 2022-09-02 MED ORDER — ONDANSETRON HCL 4 MG/2ML IJ SOLN: 4.0000 mg | Freq: Four times a day (QID) | INTRAMUSCULAR | Status: DC | PRN

## 2022-09-02 MED ORDER — LACTATED RINGERS IV SOLN: INTRAVENOUS | Status: DC

## 2022-09-02 MED ORDER — MISOPROSTOL 200 MCG PO TABS
ORAL_TABLET | ORAL | Status: AC
  Filled 2022-09-02: qty 4

## 2022-09-02 MED ORDER — SOD CITRATE-CITRIC ACID 500-334 MG/5ML PO SOLN: 30.0000 mL | ORAL | Status: DC | PRN

## 2022-09-02 MED ORDER — BENZOCAINE-MENTHOL 20-0.5 % EX AERO: 1.0000 | INHALATION_SPRAY | CUTANEOUS | Status: DC | PRN

## 2022-09-02 MED ORDER — OXYTOCIN-SODIUM CHLORIDE 30-0.9 UT/500ML-% IV SOLN: 2.5000 [IU]/h | INTRAVENOUS | Status: DC | PRN

## 2022-09-02 MED ORDER — SOD CITRATE-CITRIC ACID 500-334 MG/5ML PO SOLN
ORAL | Status: AC
  Filled 2022-09-02: qty 15

## 2022-09-02 MED ORDER — OXYTOCIN-SODIUM CHLORIDE 30-0.9 UT/500ML-% IV SOLN
INTRAVENOUS | Status: AC
  Filled 2022-09-02: qty 500

## 2022-09-02 MED ORDER — PHENYLEPHRINE 80 MCG/ML (10ML) SYRINGE FOR IV PUSH (FOR BLOOD PRESSURE SUPPORT): 80.0000 ug | PREFILLED_SYRINGE | INTRAVENOUS | Status: DC | PRN

## 2022-09-02 MED ORDER — DOCUSATE SODIUM 100 MG PO CAPS
100.0000 mg | ORAL_CAPSULE | Freq: Two times a day (BID) | ORAL | Status: DC
  Filled 2022-09-02: qty 1

## 2022-09-02 MED ORDER — FENTANYL-BUPIVACAINE-NACL 0.5-0.125-0.9 MG/250ML-% EP SOLN
12.0000 mL/h | EPIDURAL | Status: DC | PRN
  Administered 2022-09-02: 12 mL/h via EPIDURAL

## 2022-09-02 MED ORDER — LIDOCAINE-EPINEPHRINE (PF) 1.5 %-1:200000 IJ SOLN
INTRAMUSCULAR | Status: DC | PRN
  Administered 2022-09-02: 4 mL via EPIDURAL

## 2022-09-02 MED ORDER — LIDOCAINE HCL (PF) 1 % IJ SOLN
INTRAMUSCULAR | Status: DC | PRN
  Administered 2022-09-02: 3 mL via SUBCUTANEOUS

## 2022-09-02 MED ORDER — ZOLPIDEM TARTRATE 5 MG PO TABS: 5.0000 mg | ORAL_TABLET | Freq: Every evening | ORAL | Status: DC | PRN

## 2022-09-02 MED ORDER — OXYCODONE-ACETAMINOPHEN 5-325 MG PO TABS: 1.0000 | ORAL_TABLET | ORAL | Status: DC | PRN

## 2022-09-02 MED ORDER — TERBUTALINE SULFATE 1 MG/ML IJ SOLN: 0.2500 mg | Freq: Once | INTRAMUSCULAR | Status: DC | PRN

## 2022-09-02 MED ORDER — LACTATED RINGERS IV SOLN: 500.0000 mL | INTRAVENOUS | Status: DC | PRN

## 2022-09-02 MED ORDER — OXYTOCIN 10 UNIT/ML IJ SOLN
INTRAMUSCULAR | Status: AC
  Filled 2022-09-02: qty 2

## 2022-09-02 MED ORDER — FENTANYL-BUPIVACAINE-NACL 0.5-0.125-0.9 MG/250ML-% EP SOLN
EPIDURAL | Status: AC
  Filled 2022-09-02: qty 250

## 2022-09-02 MED ORDER — ACETAMINOPHEN 325 MG PO TABS
650.0000 mg | ORAL_TABLET | ORAL | Status: DC | PRN
  Administered 2022-09-03 (×2): 650 mg via ORAL
  Filled 2022-09-02 (×2): qty 2

## 2022-09-02 MED ORDER — EPHEDRINE 5 MG/ML INJ: 10.0000 mg | INTRAVENOUS | Status: DC | PRN

## 2022-09-02 MED ORDER — PRENATAL MULTIVITAMIN CH
1.0000 | ORAL_TABLET | Freq: Every day | ORAL | Status: DC
  Administered 2022-09-03: 1 via ORAL
  Filled 2022-09-02: qty 1

## 2022-09-02 MED ORDER — AMMONIA AROMATIC IN INHA
RESPIRATORY_TRACT | Status: AC
  Filled 2022-09-02: qty 10

## 2022-09-02 MED ORDER — LIDOCAINE 5 % EX PTCH
MEDICATED_PATCH | CUTANEOUS | Status: AC
  Filled 2022-09-02: qty 1

## 2022-09-02 NOTE — Progress Notes (Signed)
Janet Gilmore is a 31 y.o. G1P0000 at [redacted]w[redacted]d by ultrasound admitted for labor. She was seen on the unit yesterday for extended monitoring and sent home after several hours of EFM. She returned this morning with c/o more intense contractions.She has been admitted and is now comfortable with an epidural.  Subjective: Very comfortable. Denies LOF.   Objective: BP 130/66   Pulse 79   Temp 98.3 F (36.8 C) (Oral)   Resp 16   Ht 5\' 3"  (1.6 m)   Wt 92.1 kg   LMP 11/24/2021 (Exact Date)   BMI 35.96 kg/m  No intake/output data recorded. No intake/output data recorded.  FHT:  FHR: 145 baseline that appears to "wande"' bpm, variability: moderate,  accelerations:  Present,  decelerations:  Absent UC:   regular, every 3 minutes SVE:   Dilation: 6.5 Effacement (%): 80 Station: -1 Exam by:: [email or web address] CNM  Labs: Lab Results  Component Value Date   WBC 18.3 (H) 09/02/2022   HGB 12.7 09/02/2022   HCT 38.0 09/02/2022   MCV 83.7 09/02/2022   PLT 300 09/02/2022    Assessment / Plan: AROM- moderate meconium seen.  Labor: Progressing normally  Fetal Wellbeing:  Category II Pain Control:  Epidural I/D:  n/a Anticipated MOD:  NSVD  11/02/2022, CNM 09/02/2022, 7:57 AM

## 2022-09-02 NOTE — Anesthesia Procedure Notes (Signed)
Epidural Patient location during procedure: OB End time: 09/02/2022 7:07 AM  Staffing Performed: anesthesiologist   Preanesthetic Checklist Completed: patient identified, IV checked, site marked, risks and benefits discussed, surgical consent, monitors and equipment checked, pre-op evaluation and timeout performed  Epidural Patient position: sitting Prep: Betadine Patient monitoring: heart rate, continuous pulse ox and blood pressure Approach: midline Location: L4-L5 Injection technique: LOR saline  Needle:  Needle type: Tuohy  Needle gauge: 17 G Needle length: 9 cm and 9 Needle insertion depth: 5 cm Catheter type: closed end flexible Catheter size: 19 Gauge Catheter at skin depth: 10 cm Test dose: negative and 1.5% lidocaine with Epi 1:200 K  Assessment Sensory level: T10 Events: blood not aspirated, injection not painful, no injection resistance, no paresthesia and negative IV test  Additional Notes   Patient tolerated the insertion well without complications.-SATD -IVTD. No paresthesia. Refer to [facility] nursing for VS and dosingReason for block:procedure for pain

## 2022-09-02 NOTE — Progress Notes (Addendum)
LABOR NOTE   Janet Gilmore 31 y.o.GP@ at [redacted]w[redacted]d  SUBJECTIVE:  Denies pressure Analgesia: Epidural  OBJECTIVE:  BP (!) 122/57   Pulse 80   Temp 98.6 F (37 C) (Oral)   Resp 16   Ht 5\' 3"  (1.6 m)   Wt 92.1 kg   LMP 11/24/2021 (Exact Date)   SpO2 96%   BMI 35.96 kg/m  No intake/output data recorded.  She has shown cervical change. CERVIX: 9.5 cm:  100%:   -1:   mid position:   soft SVE:   Dilation: Lip/rim Effacement (%): 100 Station: -1 Exam by:: [email or web address] CONTRACTIONS: regular, every 1-3 minutes FHR: Fetal heart tracing reviewed. Baseline: 145 bpm, Variability: Good {> 6 bpm), Accelerations: Non-reactive but appropriate for gestational age, and Decelerations: early and variable x 1   Category II    Labs: Lab Results  Component Value Date   WBC 18.3 (H) 09/02/2022   HGB 12.7 09/02/2022   HCT 38.0 09/02/2022   MCV 83.7 09/02/2022   PLT 300 09/02/2022    ASSESSMENT: 1) Labor curve reviewed.       Progress: Active phase labor.     Membranes: ruptured, meconium thick            Principal Problem:   Indication for care in labor and delivery, antepartum   PLAN: continue present management Discussed with pt and partner use of pitocin. Instructed RN to start pitocin if contractions spacing and or not complete in the next hour.   11/02/2022, CNM  09/02/2022 10:05 AM

## 2022-09-02 NOTE — OB Triage Note (Signed)
Pt is a 31y/o G1P0 at [redacted]w[redacted]d with c/o ctx every 5 min that began to be consistent this morning at her OB appt where she was swept. Pt states they began to be 5 min apart about 2115. Pt states +FM. Pt states mucous bloody show when wiping after using the bathroom. Pt denies LOF. Monitors applied and assessing. Initial FHT 145.

## 2022-09-02 NOTE — Discharge Summary (Signed)
Postpartum Discharge Summary  Date of Service updated: 09/03/2022     Patient Name: Janet Gilmore DOB: 17-May-1991 MRN: [id number]  Date of admission: 09/02/2022 Delivery date:09/02/2022  Delivering provider: Harlin Heys  Date of discharge: 09/03/2022  Admitting diagnosis: Indication for care in labor and delivery, antepartum [O75.9] Intrauterine pregnancy: [redacted]w[redacted]d     Secondary diagnosis:  Principal Problem:   Indication for care in labor and delivery, antepartum Active Problems:   [redacted] weeks gestation of pregnancy   Postpartum care following vaginal delivery   Encounter for care or examination of lactating mother  Additional problems: none    Discharge diagnosis: Term Pregnancy Delivered    , Vacuum                                           Post partum procedures: none Augmentation: AROM and Pitocin Complications: None  Hospital course: Onset of Labor With Vacuum assisted Vaginal Delivery      31 y.o. yo G1P1001 at [redacted]w[redacted]d was admitted in Active Labor on 09/02/2022. Patient had an uncomplicated labor course as follows:  Membrane Rupture Time/Date: 7:46 AM ,09/02/2022   Delivery Method:Vaginal, Vacuum (Extractor)  Episiotomy: Right Mediolateral  Lacerations:  3rd degree   Patient had an uncomplicated postpartum course.  She is ambulating, tolerating a regular diet, passing flatus, and urinating well.   Patient is discharged home in stable condition on 09/03/22.  Newborn Data: Birth date:09/02/2022  Birth time:4:56 PM  Gender:Female   [facility] Living status:Living  Apgars:7 ,9  Weight:4350 g   Magnesium Sulfate received: No BMZ received: No Rhophylac:N/A MMR:No T-DaP:Given prenatally Flu: N/A Transfusion:No  Physical exam  Vitals:   09/02/22 2335 09/03/22 0433 09/03/22 0818 09/03/22 1619  BP: 123/63 127/76 130/77 118/73  Pulse: 89 81 84 92  Resp: $Remo'18 17  20  'AzuHQ$ Temp: 98.3 F (36.8 C) 97.8 F (36.6 C) 98.4 F (36.9 C) 98 F (36.7 C)  TempSrc:  Oral Oral  Oral  SpO2: 98% 98% 99% 99%  Weight:      Height:       General: alert, cooperative, and no distress Lochia: appropriate Uterine Fundus: firm Incision: N/A DVT Evaluation: No evidence of DVT seen on physical exam. Labs: Lab Results  Component Value Date   WBC 17.8 (H) 09/03/2022   HGB 9.6 (L) 09/03/2022   HCT 29.3 (L) 09/03/2022   MCV 84.9 09/03/2022   PLT 250 09/03/2022      Latest Ref Rng & Units 03/10/2018    9:58 AM  CMP  Glucose 65 - 99 mg/dL 103   BUN 6 - 20 mg/dL 10   Creatinine 0.44 - 1.00 mg/dL 0.62   Sodium 135 - 145 mmol/L 139   Potassium 3.5 - 5.1 mmol/L 4.4   Chloride 101 - 111 mmol/L 105   CO2 22 - 32 mmol/L 26   Calcium 8.9 - 10.3 mg/dL 9.3   Total Protein 6.5 - 8.1 g/dL 8.2   Total Bilirubin 0.3 - 1.2 mg/dL 0.6   Alkaline Phos 38 - 126 U/L 57   AST 15 - 41 U/L 24   ALT 14 - 54 U/L 17    Edinburgh Score:    09/03/2022    9:41 AM  Edinburgh Postnatal Depression Scale Screening Tool  I have been able to laugh and see the funny side of things. 0  I have  looked forward with enjoyment to things. 0  I have blamed myself unnecessarily when things went wrong. 2  I have been anxious or worried for no good reason. 1  I have felt scared or panicky for no good reason. 0  Things have been getting on top of me. 0  I have been so unhappy that I have had difficulty sleeping. 0  I have felt sad or miserable. 0  I have been so unhappy that I have been crying. 0  The thought of harming myself has occurred to me. 0  Edinburgh Postnatal Depression Scale Total 3      After visit meds:  Allergies as of 09/03/2022       Reactions   Doxylamine Other (See Comments)   Adhesive [tape]    Other    Nickle, gold, stainless steel   Sulfa Antibiotics    Nickel Rash        Medication List     STOP taking these medications    Naftin 2 % Gel Generic drug: Naftifine HCl   triamcinolone cream 0.1 % Commonly known as: KENALOG       TAKE these medications     glycopyrrolate 1 MG tablet Commonly known as: ROBINUL TAKE 1 TABLET(1 MG) BY MOUTH TWICE DAILY   levocetirizine 5 MG tablet Commonly known as: XYZAL Take 5 mg by mouth daily.   prenatal multivitamin Tabs tablet Take 1 tablet by mouth daily at 12 noon.   VITAMIN D PO Take by mouth.         Discharge home in stable condition Infant Feeding:  Breast and Formula Infant Disposition:home with mother Discharge instruction: per After Visit Summary and Postpartum booklet. Activity: Advance as tolerated. Pelvic rest for 6 weeks.  Diet: routine diet Anticipated Birth Control: POPs Postpartum Appointment:6 weeks Additional Postpartum F/U:  none Future Appointments:No future appointments. Follow up Visit:  [place]. Schedule an appointment as soon as possible for a visit in 6 week(s).   Specialty: Obstetrics and Gynecology Why: postpartum follow up and sooner as needed Contact information: [place] [address] [place], [facility] 09/03/2022, 6:08 PM

## 2022-09-02 NOTE — Progress Notes (Addendum)
LABOR NOTE   Janet Gilmore 31 y.o.GP@ at [redacted]w[redacted]d  SUBJECTIVE:  Pt feeling some mild pressure  Analgesia: Epidural  OBJECTIVE:  BP 125/79   Pulse 87   Temp 100.2 F (37.9 C) (Axillary)   Resp 16   Ht 5\' 3"  (1.6 m)   Wt 92.1 kg   LMP 11/24/2021 (Exact Date)   SpO2 99%   BMI 35.96 kg/m  No intake/output data recorded.  She has shown cervical change. CERVIX:9.5 no cervical change.   CONTRACTIONS: regular, every 2-3 minutes FHR: Fetal heart tracing reviewed. Baseline: 145 bpm, Variability: Good {> 6 bpm), Accelerations: Reactive, and Decelerations: early and variable   Category II    Labs: Lab Results  Component Value Date   WBC 18.3 (H) 09/02/2022   HGB 12.7 09/02/2022   HCT 38.0 09/02/2022   MCV 83.7 09/02/2022   PLT 300 09/02/2022    ASSESSMENT: 1) Labor curve reviewed.       Progress: Active phase labor.     Membranes: ruptured, meconium thick      Principal Problem:   Indication for care in labor and delivery, antepartum   PLAN: Epidural turned down. RN instructed to start pitocin due to poor pushing effort.   11/02/2022, CNM  09/02/2022

## 2022-09-02 NOTE — Progress Notes (Signed)
LABOR NOTE   Janet Gilmore 31 y.o.GP@ at [redacted]w[redacted]d  SUBJECTIVE:  Pt pushing Analgesia: Epidural  OBJECTIVE:  BP (!) 155/83   Pulse 85   Temp 100.2 F (37.9 C) (Axillary)   Resp 16   Ht 5\' 3"  (1.6 m)   Wt 92.1 kg   LMP 11/24/2021 (Exact Date)   SpO2 99%   BMI 35.96 kg/m  No intake/output data recorded.   SVE:   Dilation: 10 Effacement (%): 100 Station: Plus 1 Exam by:: HWC RN CONTRACTIONS: regular, every 1-2  minutes FHR: Fetal heart tracing reviewed. Baseline: 145 bpm, Variability: Good {> 6 bpm), Accelerations: Non-reactive but appropriate for gestational age, and Decelerations: variables with pushing  Category II    Labs: Lab Results  Component Value Date   WBC 18.3 (H) 09/02/2022   HGB 12.7 09/02/2022   HCT 38.0 09/02/2022   MCV 83.7 09/02/2022   PLT 300 09/02/2022    ASSESSMENT: 1) Labor curve reviewed.       Progress: Active phase labor.     Membranes: ruptured, meconium thick     Principal Problem:   Indication for care in labor and delivery, antepartum   PLAN: Pushing x 70 min with minimal progress. PT state she is not feeling rectal pressure, having difficulty identifying when she is contracting. Epdiural decreased to 7 ml/hr, pitocin @ 16 mu. Temperature 100.2 F. Tylenol ordered.  Dr. 11/02/2022 notified of pt status.   Logan Bores, CNM   Doreene Burke, M.D. 09/02/2022 3:50 PM

## 2022-09-02 NOTE — H&P (Signed)
Janet Gilmore is a 31 y.o. female presenting for labor. She arrived with complaints of contractions at approximately 0100 this morning. After several hours under observation, she was rechecked and found to be 4 cms dilated.She desires an epidural.Her husband is present and supportive.She has been coping well with contractions. OB History     Gravida  2   Para  0   Term  0   Preterm  0   AB  0   Living  0      SAB  0   IAB  0   Ectopic  0   Multiple  0   Live Births  0          Past Medical History:  Diagnosis Date   Allergic rhinitis    IBS (irritable bowel syndrome)    Lactose intolerance    symptamatic but not diagnosed   Medical history non-contributory    Rash    Past Surgical History:  Procedure Laterality Date   MOUTH SURGERY     x 3   TONSILLECTOMY     WISDOM TOOTH EXTRACTION     Family History: family history includes Breast cancer (age of onset: 51) in her paternal aunt; Breast cancer (age of onset: 64) in her paternal grandmother; Colon polyps in her father and mother; Dementia in her maternal grandmother; Diabetes in her paternal grandfather; Diverticulitis in her paternal grandfather; GER disease in her brother; Heart disease in her maternal grandfather; Irritable bowel syndrome in her mother; Lymphoma in her father; Non-Hodgkin's lymphoma in her father. Social History:  reports that she has never smoked. She has never used smokeless tobacco. She reports that she does not currently use alcohol. She reports that she does not use drugs.     Maternal Diabetes: No Genetic Screening: Normal Maternal Ultrasounds/Referrals: Normal Fetal Ultrasounds or other Referrals:  None Maternal Substance Abuse:  No Significant Maternal Medications:  None Significant Maternal Lab Results:  Group B Strep negative Number of Prenatal Visits:greater than 3 verified prenatal visits Other Comments:  None  Review of Systems  Constitutional: Negative.   HENT:  Negative.    Eyes: Negative.   Cardiovascular: Negative.   Gastrointestinal:        Gravid abdomen  Endocrine: Negative.   Genitourinary: Negative.   Musculoskeletal: Negative.   Neurological: Negative.   Hematological: Negative.   Psychiatric/Behavioral: Negative.     History Dilation: 6.5 Effacement (%): 80 Station: 0 Exam by:: Waymond Meador CNM Blood pressure 126/86, pulse 90, temperature 98.3 F (36.8 C), temperature source Oral, resp. rate 16, height 5\' 3"  (1.6 m), weight 92.1 kg, last menstrual period 11/24/2021, unknown if currently breastfeeding. Exam Physical Exam  Prenatal labs: ABO, Rh: --/--/PENDING (09/12 0410) Antibody: PENDING (09/12 0410) Rubella: 1.85 (01/24 1541) RPR: Non Reactive (06/26 1039)  HBsAg: Negative (01/24 1541)  HIV: Non Reactive (06/26 1039)  GBS: Negative/-- (08/08 1000)   Assessment/Plan: IUP 40+ weeks gestation Active labor Category 2 FHTS, overall reassuring Desires epidural Admitted with routine labs and orders IV access Anesthesia notified regarding her desire for epidural. EFM Will AROM once comfortable with epidural.  09-20-1993, CNM  09/02/2022 6:54 AM     11/02/2022 09/02/2022, 6:45 AM

## 2022-09-02 NOTE — Progress Notes (Signed)
LABOR NOTE   Janet Gilmore 31 y.o.GP@ at [redacted]w[redacted]d  SUBJECTIVE:  Mild pressure with some contractions, not feeling urge to push.  Analgesia: Epidural  OBJECTIVE:  BP (!) 155/83   Pulse 85   Temp 100.2 F (37.9 C) (Axillary)   Resp 16   Ht 5\' 3"  (1.6 m)   Wt 92.1 kg   LMP 11/24/2021 (Exact Date)   SpO2 99%   BMI 35.96 kg/m  No intake/output data recorded.  She has shown cervical change. CERVIX: 10 cm:  per rn exam  SVE:   Dilation: 10 Effacement (%): 100 Station: Plus 1 Exam by:: HWC RN CONTRACTIONS: regular, every 2-4 minutes FHR: Fetal heart tracing reviewed. Baseline: 145 bpm, Variability: Good {> 6 bpm), Accelerations: absent, and Decelerations: early , late , and variable  Category II    Labs: Lab Results  Component Value Date   WBC 18.3 (H) 09/02/2022   HGB 12.7 09/02/2022   HCT 38.0 09/02/2022   MCV 83.7 09/02/2022   PLT 300 09/02/2022    ASSESSMENT: 1) Labor curve reviewed.       Progress: pushing, poorly      Membranes: ruptured, meconium thick      Principal Problem:   Indication for care in labor and delivery, antepartum   PLAN: Epidural titrated down 8 ml/hr, increasing pitocin per protocol. Position changes with pushing and continued coaching to help pt push correctly.    11/02/2022, CNM  09/02/2022 3:45 PM

## 2022-09-02 NOTE — Progress Notes (Signed)
LABOR NOTE   Janet Gilmore 31 y.o.GP@ at [redacted]w[redacted]d  SUBJECTIVE:  Pt feeling comfortable  Analgesia: Epidural  OBJECTIVE:  BP (!) 118/95   Pulse 95   Temp 98.6 F (37 C) (Oral)   Resp 16   Ht 5\' 3"  (1.6 m)   Wt 92.1 kg   LMP 11/24/2021 (Exact Date)   SpO2 99%   BMI 35.96 kg/m  Total I/O In: -  Out: 475 [Urine:175; Blood:300]  She has shown cervical change. CERVIX: 10 cm:  100%:   +2:   not felt:    SVE:   Dilation: 10 Effacement (%): 100 Station: Plus 1 Exam by:: HWC RN CONTRACTIONS: regular, every 2-4 minutes FHR: Fetal heart tracing reviewed. Baseline: 155 bpm, Variability: Good {> 6 bpm), Accelerations: Non-reactive but appropriate for gestational age, and Decelerations: early and variable  Category II    Labs: Lab Results  Component Value Date   WBC 18.3 (H) 09/02/2022   HGB 12.7 09/02/2022   HCT 38.0 09/02/2022   MCV 83.7 09/02/2022   PLT 300 09/02/2022    ASSESSMENT: 1) Labor curve reviewed.       Progress: Active phase labor.     Membranes: intact, meconium thick  Principal Problem:   Indication for care in labor and delivery, antepartum   PLAN: Dr. 11/02/2022 at the bedside to evaluate pt, on exam fetal head noted +2 station. Discussed options for assisted vaginal birth . Patient and her partner are in agreement . All questions answered. See Dr. Logan Bores note for delivery.    Logan Bores, CNM  09/02/2022 5:32 PM

## 2022-09-02 NOTE — Progress Notes (Addendum)
LABOR NOTE   Janet Gilmore 31 y.o.GP@ at [redacted]w[redacted]d  SUBJECTIVE:  Pt feeling contractions more but denies feeling fingers on digital exam.  Analgesia: Epidural  OBJECTIVE:  BP (!) 155/83   Pulse 85   Temp 100.2 F (37.9 C) (Axillary)   Resp 16   Ht 5\' 3"  (1.6 m)   Wt 92.1 kg   LMP 11/24/2021 (Exact Date)   SpO2 99%   BMI 35.96 kg/m  No intake/output data recorded.   CERVIX: 10cm:  100%:   +1:  caput SVE:   Dilation: 10 Effacement (%): 100 Station: Plus 1 Exam by:: HWC RN CONTRACTIONS: regular, every 1-4 minutes FHR: Fetal heart tracing reviewed. Baseline: 155 bpm, Variability: Good {> 6 bpm), Accelerations: Non-reactive but appropriate for gestational age, and Decelerations: variable and early  Category II    Labs: Lab Results  Component Value Date   WBC 18.3 (H) 09/02/2022   HGB 12.7 09/02/2022   HCT 38.0 09/02/2022   MCV 83.7 09/02/2022   PLT 300 09/02/2022    ASSESSMENT: 1) Labor curve reviewed.       Progress: pushing     Membranes: ruptured, meconium thick     Principal Problem:   Indication for care in labor and delivery, antepartum   PLAN: Discussed concern with pt and her partner regarding minimal fetal descent with pushing. Also discussed rising temperature.  Reviewed attempts to improve her pushing effort with decreasing the epidural  rate and increasing pitocin to improve contraction strength. We have tried several position changes with minimal descent of fetal head. Pt tearful and request time to talk with her partner. After talking with her partner she is requesting a primary cesarean section. Dr. 11/02/2022 notified.    Logan Bores, CNM . 09/02/2022 3:57 PM

## 2022-09-02 NOTE — Progress Notes (Signed)
Verbal consent obtained by MD- pt verbalized understanding and had no questions or concerns.  1630 vacuum applied, no pop offs.  1656 Delivery of Baby.

## 2022-09-02 NOTE — Anesthesia Preprocedure Evaluation (Signed)
Anesthesia Evaluation  °Patient identified by MRN, date of birth, ID band °Patient awake ° ° ° °Reviewed: °Allergy & Precautions, H&P , NPO status , Patient's Chart, lab work & pertinent test results, reviewed documented beta blocker date and time  ° °Airway °Mallampati: II ° °TM Distance: >3 FB °Neck ROM: full ° ° ° Dental °no notable dental hx. °(+) Teeth Intact °  °Pulmonary ° °  °Pulmonary exam normal °breath sounds clear to auscultation ° ° ° ° ° ° Cardiovascular °Exercise Tolerance: Good °negative cardio ROS ° ° °Rhythm:regular Rate:Normal ° ° °  °Neuro/Psych °negative neurological ROS ° negative psych ROS  ° GI/Hepatic °negative GI ROS, Neg liver ROS,   °Endo/Other  °negative endocrine ROSdiabetes, Gestational ° Renal/GU °  ° °  °Musculoskeletal ° ° Abdominal °  °Peds ° Hematology °negative hematology ROS °(+)   °Anesthesia Other Findings ° ° Reproductive/Obstetrics °(+) Pregnancy ° °  ° ° ° ° ° ° ° ° ° ° ° ° ° °  °  ° ° ° ° ° ° ° ° °Anesthesia Physical °Anesthesia Plan ° °ASA: 2 ° °Anesthesia Plan: Epidural  ° °Post-op Pain Management:   ° °Induction:  ° °PONV Risk Score and Plan:  ° °Airway Management Planned:  ° °Additional Equipment:  ° °Intra-op Plan:  ° °Post-operative Plan:  ° °Informed Consent: I have reviewed the patients History and Physical, chart, labs and discussed the procedure including the risks, benefits and alternatives for the proposed anesthesia with the patient or authorized representative who has indicated his/her understanding and acceptance.  ° ° ° ° ° °Plan Discussed with:  ° °Anesthesia Plan Comments:   ° ° ° ° ° ° °Anesthesia Quick Evaluation ° °

## 2022-09-03 DIAGNOSIS — Z3A40 40 weeks gestation of pregnancy: Secondary | ICD-10-CM

## 2022-09-03 LAB — CBC
HCT: 29.3 % — ABNORMAL LOW (ref 36.0–46.0)
Hemoglobin: 9.6 g/dL — ABNORMAL LOW (ref 12.0–15.0)
MCH: 27.8 pg (ref 26.0–34.0)
MCHC: 32.8 g/dL (ref 30.0–36.0)
MCV: 84.9 fL (ref 80.0–100.0)
Platelets: 250 10*3/uL (ref 150–400)
RBC: 3.45 MIL/uL — ABNORMAL LOW (ref 3.87–5.11)
RDW: 13.9 % (ref 11.5–15.5)
WBC: 17.8 10*3/uL — ABNORMAL HIGH (ref 4.0–10.5)
nRBC: 0 % (ref 0.0–0.2)

## 2022-09-03 LAB — RPR: RPR Ser Ql: NONREACTIVE

## 2022-09-03 NOTE — Progress Notes (Signed)
Patient discharged home with infant. Discharge instructions, prescriptions, and follow-up appointment were given to and reviewed with patient by previous shift RN. Escorted out by [facility].

## 2022-09-03 NOTE — Lactation Note (Signed)
This note was copied from a baby's chart. Lactation Consultation Note  Patient Name: Janet Gilmore PBAQV'O Date: 09/03/2022 Reason for consult: Initial assessment Age:31 hours  Maternal Data This is mom's 1st baby, vaginal vacuum delivery. Met with mom today. Mom is bottle feeding baby formula. She has brought her DEBP Spectra 2 from home to have assistance on use. Mom reports she is still trying to figure out if she wants to also pump and provide milk. Does the patient have breastfeeding experience prior to this delivery?: No  Feeding Mother's Current Feeding Choice: Breast Milk and Formula Mom reports she is formula feeding and will try pumping and providing some breastmilk  Lactation Tools Discussed/Used  Mom brought her Spectra 2 pump to the hospital. Assisted mom with use of pump, cleaning of parts. Mom obtained a few drops of colostrum which was provided to baby.  Interventions Interventions: Education;DEBP Reviewed milk storage guidelines for normal newborn at home. Support and encouragement provided.  Discharge Discharge Education: Other (comment);Engorgement and breast care;Warning signs for feeding baby (Mom reports she will bring baby for F/U to [facility]. Mom is aware if she has BF questions or would like to be seen outpatient once she goes home she can call [facility] lactation.) Pump: Personal (Spectra 2)  Consult Status Consult Status: PRN  Update provided to care nurse.  Jonna Keval Nam 09/03/2022, 4:00 PM

## 2022-09-03 NOTE — Progress Notes (Signed)
Subjective:  Doing well postpartum day 1; she is tolerating regular diet, her pain is controlled with PO medication, she is ambulating and voiding without difficulty. She reports some trouble with newborn latching. She has supplemented with formula and plans to pump/breastfeed. She would like to discharge to home today if all is well with newborn screening.  Objective:  Vital signs in last 24 hours: Temp:  [97.8 F (36.6 C)-100.2 F (37.9 C)] 98.4 F (36.9 C) (09/13 0818) Pulse Rate:  [80-100] 84 (09/13 0818) Resp:  [17-18] 17 (09/13 0433) BP: (113-155)/(62-95) 130/77 (09/13 0818) SpO2:  [95 %-100 %] 99 % (09/13 0818)    General: NAD Pulmonary: no increased work of breathing Abdomen: non-distended, non-tender, fundus firm at level of umbilicus Extremities: no edema, no erythema, no tenderness  Results for orders placed or performed during the hospital encounter of 09/02/22 (from the past 72 hour(s))  CBC     Status: Abnormal   Collection Time: 09/02/22  4:10 AM  Result Value Ref Range   WBC 18.3 (H) 4.0 - 10.5 K/uL   RBC 4.54 3.87 - 5.11 MIL/uL   Hemoglobin 12.7 12.0 - 15.0 g/dL   HCT 97.6 73.4 - 19.3 %   MCV 83.7 80.0 - 100.0 fL   MCH 28.0 26.0 - 34.0 pg   MCHC 33.4 30.0 - 36.0 g/dL   RDW 79.0 24.0 - 97.3 %   Platelets 300 150 - 400 K/uL   nRBC 0.0 0.0 - 0.2 %    Comment: Performed at [facility], [address] [place], [place] [address]  RPR     Status: None   Collection Time: 09/02/22  4:10 AM  Result Value Ref Range   RPR Ser Ql NON REACTIVE NON REACTIVE    Comment: Performed at [facility], [address] [place], [place] [address]  ABO/Rh     Status: None   Collection Time: 09/02/22  4:10 AM  Result Value Ref Range   ABO/RH(D)      O POS Performed at [facility], [address] [place], [place] [address]   Type and screen     Status: None   Collection Time: 09/02/22  6:15 AM  Result Value Ref Range   ABO/RH(D) O POS     Antibody Screen NEG    Sample Expiration      09/05/2022,2359 Performed at [facility], [address] [place], [place] [address]   CBC     Status: Abnormal   Collection Time: 09/03/22  5:06 AM  Result Value Ref Range   WBC 17.8 (H) 4.0 - 10.5 K/uL   RBC 3.45 (L) 3.87 - 5.11 MIL/uL   Hemoglobin 9.6 (L) 12.0 - 15.0 g/dL   HCT 98.9 (L) 21.1 - 94.1 %   MCV 84.9 80.0 - 100.0 fL   MCH 27.8 26.0 - 34.0 pg   MCHC 32.8 30.0 - 36.0 g/dL   RDW 74.0 81.4 - 48.1 %   Platelets 250 150 - 400 K/uL   nRBC 0.0 0.0 - 0.2 %    Comment: Performed at [facility], [address], [place], [place] [address]    Assessment:   31 y.o. G1P1001 postpartum day # 1 VAVD, lactating  Plan:    1) Acute blood loss anemia - hemodynamically stable and asymptomatic - po ferrous sulfate  2) Blood Type --/--/O POS (09/12 0615) / Rubella 1.85 (01/24 1541) / Varicella Non-Immune  3) TDAP status up to date  4) Feeding plan  Breast and Formula  5)  Education given regarding options for contraception, as well as compatibility with breast feeding if applicable.  Patient plans on oral progesterone-only contraceptive for contraception.  6) Disposition: continue current care with possible discharge after 24 hour newborn screens   Tresea Mall, CNM [facility] 09/03/2022, 10:45 AM

## 2022-09-09 NOTE — Anesthesia Postprocedure Evaluation (Signed)
Anesthesia Post Note  Patient: Janet Gilmore  Procedure(s) Performed: AN AD HOC LABOR EPIDURAL     Patient location during evaluation: Mother Baby Anesthesia Type: Epidural Level of consciousness: awake and alert Pain management: pain level controlled Vital Signs Assessment: post-procedure vital signs reviewed and stable Respiratory status: spontaneous breathing, nonlabored ventilation and respiratory function stable Cardiovascular status: stable Postop Assessment: no headache, no backache and epidural receding Anesthetic complications: no Comments: Based on chart review. ja   No notable events documented.  Molli Barrows

## 2022-09-16 ENCOUNTER — Ambulatory Visit (INDEPENDENT_AMBULATORY_CARE_PROVIDER_SITE_OTHER): Payer: BC Managed Care – PPO | Admitting: Licensed Practical Nurse

## 2022-09-16 ENCOUNTER — Encounter: Payer: Self-pay | Admitting: Licensed Practical Nurse

## 2022-09-16 DIAGNOSIS — Z30011 Encounter for initial prescription of contraceptive pills: Secondary | ICD-10-CM

## 2022-09-16 DIAGNOSIS — Z1332 Encounter for screening for maternal depression: Secondary | ICD-10-CM

## 2022-09-16 MED ORDER — LEVONORGESTREL-ETHINYL ESTRAD 0.1-20 MG-MCG PO TABS: 1.0000 | ORAL_TABLET | Freq: Every day | ORAL | 11 refills | Status: DC

## 2022-09-16 NOTE — Progress Notes (Signed)
Postpartum Visit  Chief Complaint:  Chief Complaint  Patient presents with   Postpartum Care    History of Present Illness: Patient is a 31 y.o. G1P1001 presents for postpartum visit.  Date of delivery: 09/02/2022 Type of delivery: Vaginal delivery - Vacuum or forceps assisted  yes Episiotomy yes Laceration: yes 3rd degree  Pregnancy or labor problems:  no Any problems since the delivery:  no Bleeding: light throughout the day, but then has a heavy bleed after being the shower, this happened over the last 2-3 days, denies foul odor or abd pain.  Perineum: stitches are itchy Sleep: getting good sleep Stopped breastfeeding-did not have a good latch and when she pumped she barely expressed anything, now giving formula.  Mood: tearful at times, felt like she had the baby blues, starting to feel better Has good support, per partner will be home for 6 weeks Desires OCP's  Returns to work Nov 20  Newborn Details:  SINGLETON :  1. Baby's name: Gaynell Face. Birth weight: 4350 grams Maternal Details:  Breast Feeding:  no Post partum depression/anxiety noted:  baby blues Edinburgh Post-Partum Depression Score:  6  Date of last PAP: 01/14/2022  normal   Past Medical History:  Diagnosis Date   Allergic rhinitis    IBS (irritable bowel syndrome)    Lactose intolerance    symptamatic but not diagnosed   Medical history non-contributory    Rash     Past Surgical History:  Procedure Laterality Date   MOUTH SURGERY     x 3   TONSILLECTOMY     WISDOM TOOTH EXTRACTION      Prior to Admission medications   Medication Sig Start Date End Date Taking? Authorizing Provider  glycopyrrolate (ROBINUL) 1 MG tablet TAKE 1 TABLET(1 MG) BY MOUTH TWICE DAILY 01/21/22  Yes Meryl Dare, MD  levocetirizine (XYZAL) 5 MG tablet Take 5 mg by mouth daily.   Yes [provider]  levonorgestrel-ethinyl estradiol (ALESSE) 0.1-20 MG-MCG tablet Take 1 tablet by mouth daily. 10/19/22  Yes Kegan Shepardson,  Courtney Heys, CNM  VITAMIN D PO Take by mouth.   Yes [provider]    Allergies  Allergen Reactions   Doxylamine Other (See Comments)   Adhesive [Tape]    Other     Nickle, gold, stainless steel   Sulfa Antibiotics    Nickel Rash     Social History   Socioeconomic History   Marital status: Married    Spouse name: Not on file   Number of children: 0   Years of education: Not on file   Highest education level: Not on file  Occupational History   Occupation: Runner, broadcasting/film/video  Tobacco Use   Smoking status: Never   Smokeless tobacco: Never  Vaping Use   Vaping Use: Never used  Substance and Sexual Activity   Alcohol use: Not Currently    Comment: social   Drug use: No   Sexual activity: Yes    Partners: Male    Birth control/protection: Pill  Other Topics Concern   Not on file  Social History Narrative   Not on file   Social Determinants of Health   Financial Resource Strain: Not on file  Food Insecurity: No Food Insecurity (09/02/2022)   Hunger Vital Sign    Worried About Running Out of Food in the Last Year: Never true    Ran Out of Food in the Last Year: Never true  Transportation Needs: No Transportation Needs (09/02/2022)   PRAPARE - Transportation  Lack of Transportation (Medical): No    Lack of Transportation (Non-Medical): No  Physical Activity: Insufficiently Active (12/30/2017)   Exercise Vital Sign    Days of Exercise per Week: 2 days    Minutes of Exercise per Session: 40 min  Stress: Stress Concern Present (12/30/2017)   [place]    Feeling of Stress : To some extent  Social Connections: Moderately Integrated (12/30/2017)   Social Connection and Isolation Panel [NHANES]    Frequency of Communication with Friends and Family: More than three times a week    Frequency of Social Gatherings with Friends and Family: Once a week    Attends Religious Services: More than 4 times per year     Active Member of [employer] or Organizations: Yes    Attends Music therapist: More than 4 times per year    Marital Status: Never married  Intimate Partner Violence: Not At Risk (12/30/2017)   Humiliation, Afraid, Rape, and Kick questionnaire    Fear of Current or Ex-Partner: No    Emotionally Abused: No    Physically Abused: No    Sexually Abused: No    Family History  Problem Relation Age of Onset   Non-Hodgkin's lymphoma Father    Colon polyps Father    Lymphoma Father    Breast cancer Paternal Grandmother 108   Colon polyps Mother    Irritable bowel syndrome Mother    GER disease Brother    Dementia Maternal Grandmother    Heart disease Maternal Grandfather    Diabetes Paternal Grandfather    Diverticulitis Paternal Grandfather    Breast cancer Paternal Aunt 40       "gene neg"    Review of Systems  Constitutional: Negative.   Respiratory: Negative.    Cardiovascular: Negative.   Gastrointestinal: Negative.   Genitourinary: Negative.   Musculoskeletal: Negative.   Neurological: Negative.   Psychiatric/Behavioral:  Negative for depression. The patient is not nervous/anxious.      Physical Exam BP 124/74   Ht 5\' 3"  (1.6 m)   Wt 174 lb (78.9 kg)   LMP 11/24/2021 (Exact Date)   BMI 30.82 kg/m   Physical Exam Constitutional:      Appearance: Normal appearance.  Genitourinary:     Vulva normal.     Genitourinary Comments: Laceration healing Bimanual exam: non tender uterus about 10wks size   Pulmonary:     Effort: Pulmonary effort is normal.  Abdominal:     Tenderness: There is no abdominal tenderness.  Musculoskeletal:     Cervical back: Normal range of motion and neck supple.  Neurological:     Mental Status: She is alert.  Psychiatric:        Mood and Affect: Mood normal.      Female Chaperone present during breast and/or pelvic exam.  Assessment: 31 y.o. G1P1001 presenting for 2 week postpartum visit  Plan: Problem List Items Addressed  This Visit   None Visit Diagnoses     Encounter for initial prescription of contraceptive pills    -  Primary   Relevant Medications   levonorgestrel-ethinyl estradiol (ALESSE) 0.1-20 MG-MCG tablet (Start on 10/19/2022)        1) Contraception Education given regarding options for contraception, including oral contraceptives. Script for OCP's sent, will start in 2-4 weeks.   2)  Pap - ASCCP guidelines and rational discussed.  Patient opts for 14yr screening interval  3) Patient underwent screening for postpartum depression  with some concerns noted.  4) Follow up 4 wks for PP exam  Carie Caddy, CNM  [facility], [facility]  09/19/22  11:27 AM

## 2022-09-25 IMAGING — US US OB FOLLOW-UP
1 series · 15 of 28 positions shown · non-contrast
Comparison: none

CLINICAL DATA: Incomplete fetal anatomic survey

EXAM:
OBSTETRIC 14+ WK ULTRASOUND FOLLOW-UP

[Series 1: us ob follow up · 15 of 43 slices shown]
[im 1/43]
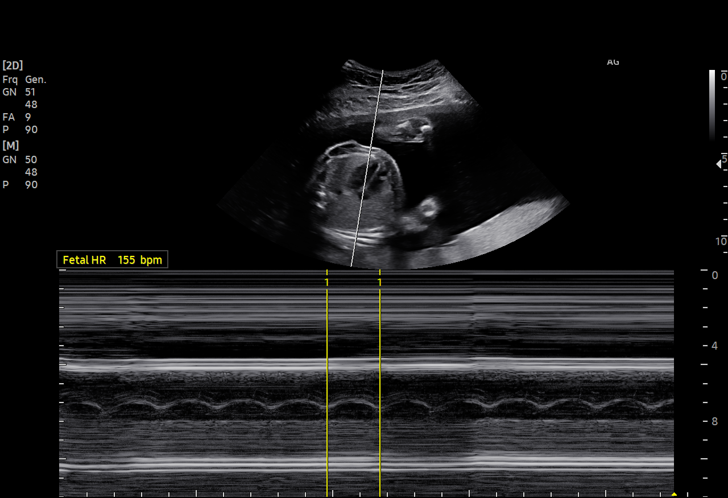
[im 4/43]
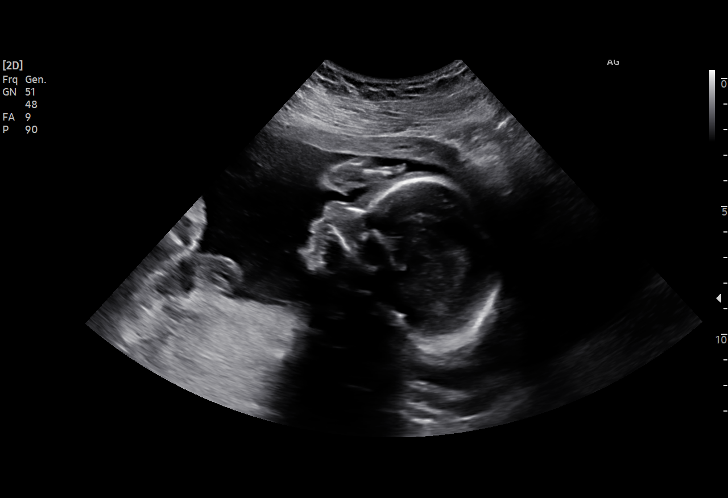
[im 7/43]
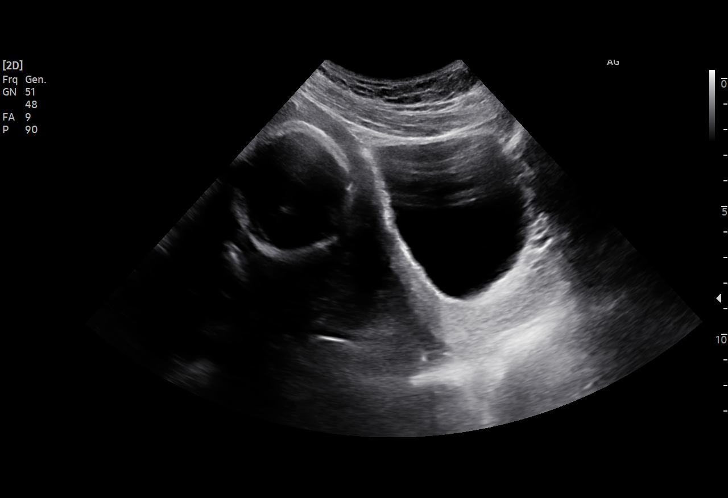
[im 10/43]
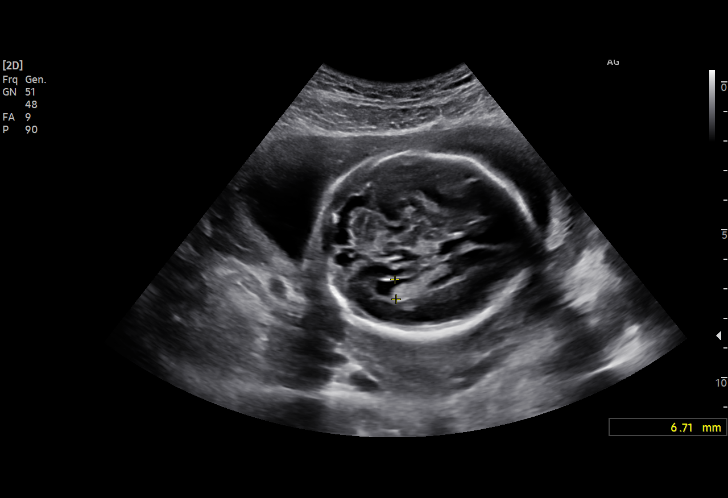
[im 13/43]
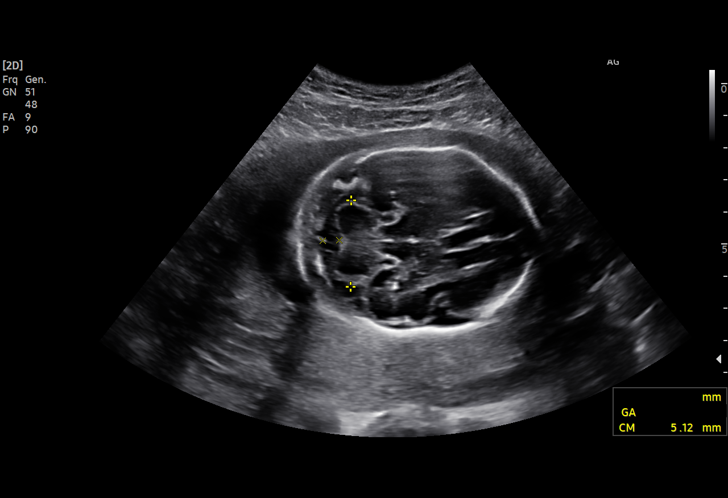
[im 16/43]
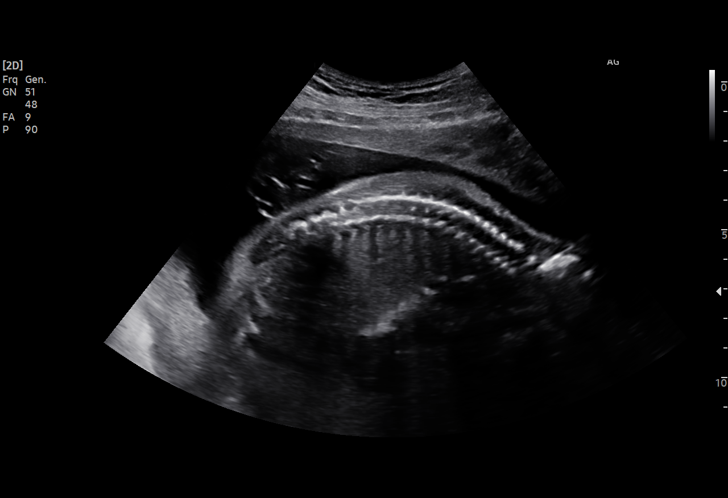
[im 19/43]
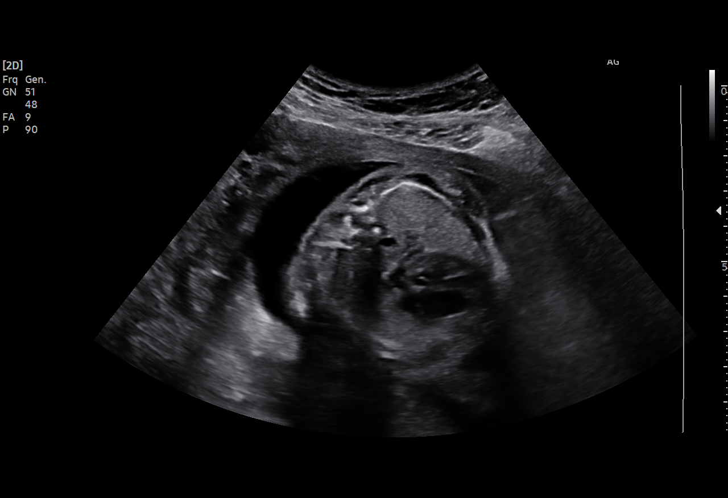
[im 22/43]
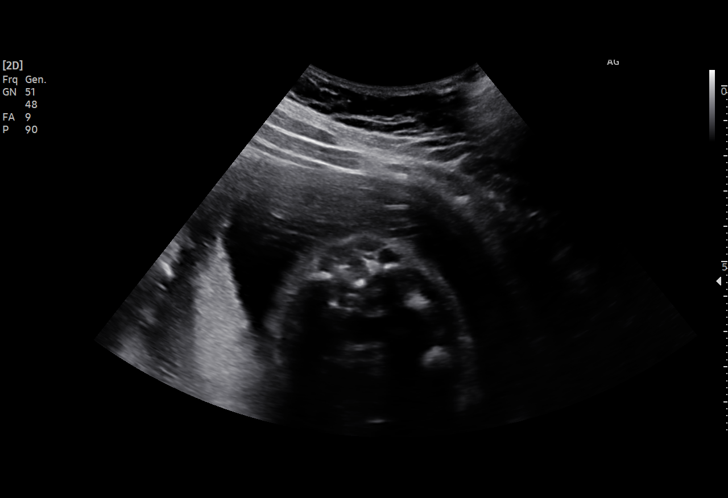
[im 24/43]
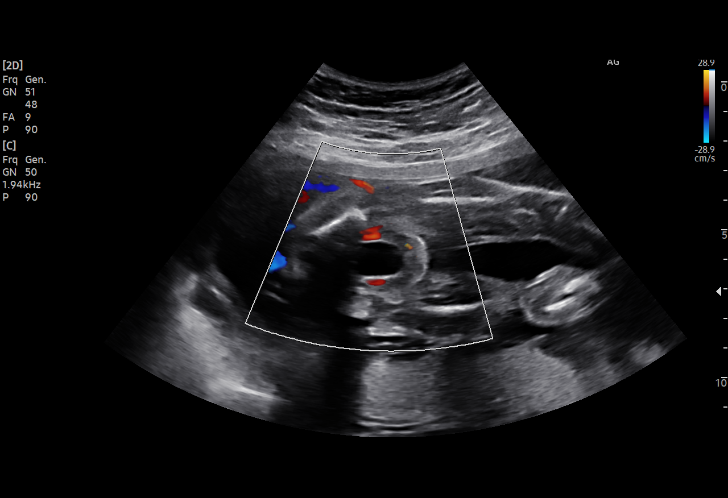
[im 27/43]
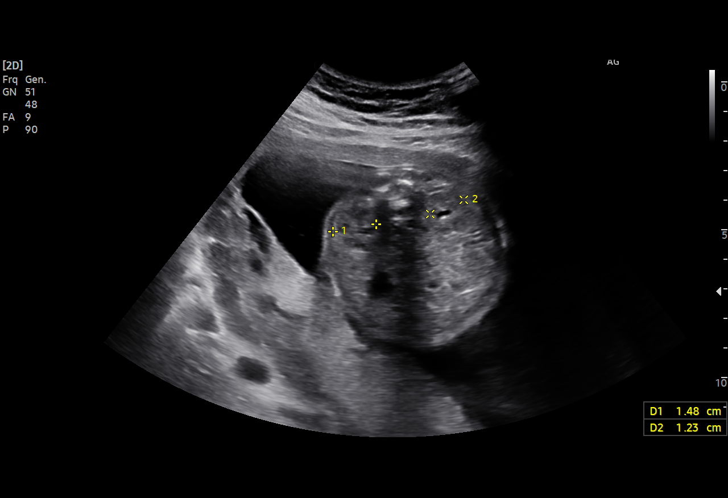
[im 30/43]
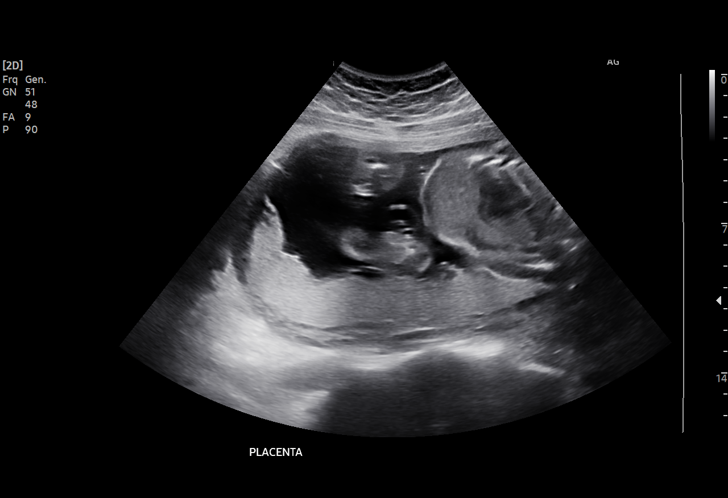
[im 33/43]
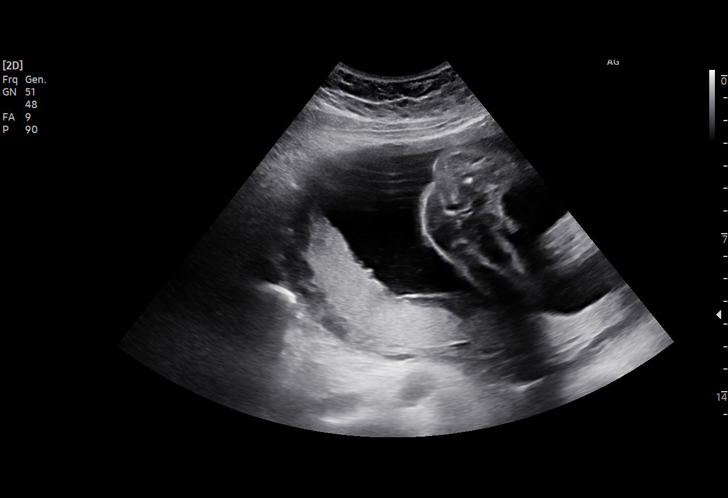
[im 36/43]
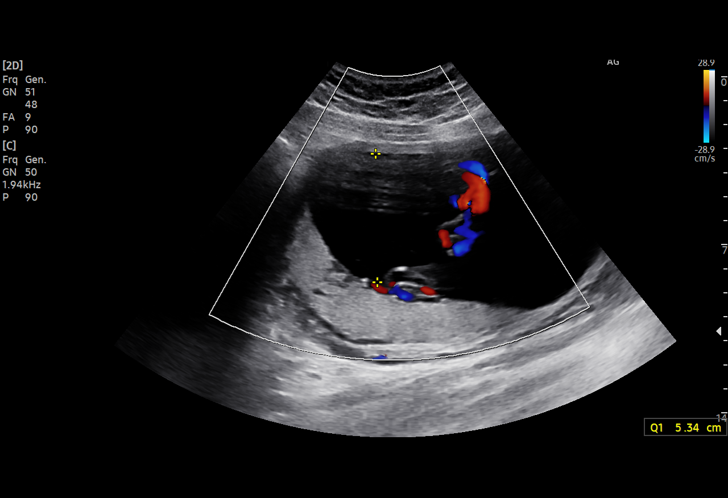
[im 39/43]
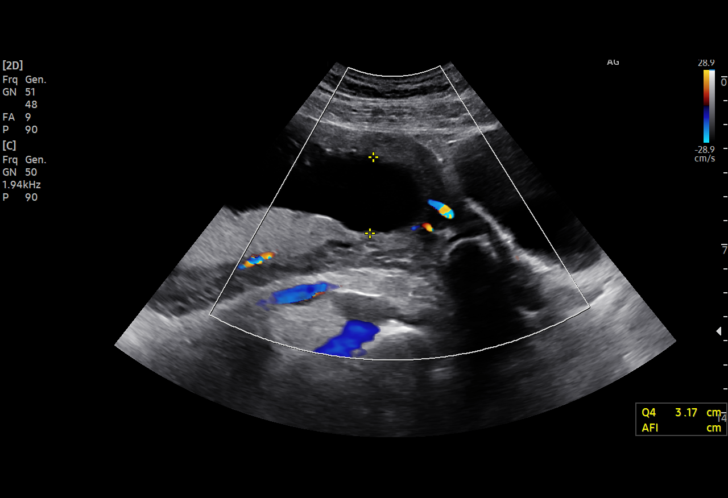
[im 43/43]
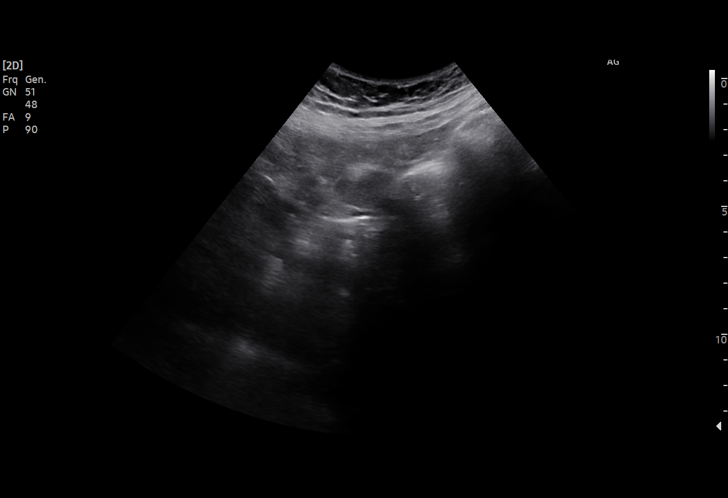

[15 of 28 positions shown; findings below may reference images not displayed]

FINDINGS: Number of Fetuses: 1

Heart Rate:  155 bpm

Movement: Yes

Presentation: Cephalic

Previa: No

Placental Location: Posterior

Amniotic Fluid (Subjective): Normal

Amniotic Fluid (Objective):

Vertical pocket 6.5cm

FETAL BIOMETRY

BPD:  6.0cm 24w 4d

HC:    22.8cm 24w 6d

AC:    21.7cm 26w 1d

FL:    4.5cm 24w 5d

Current Mean GA: 24w 6d US EDC: 08/19/2022

Assigned GA: 23w 1d Assigned EDC: 08/31/2022

FETAL ANATOMY

Lateral ventricles, thalami, posterior fossa, spine, four-chamber
heart, stomach, kidneys, bladder are again identified and
unremarkable. The fetal profile is well visualized and normal. The
remainder of the anatomic survey was completed previously.

Technical Limitations: None

Maternal Findings:

Cervix:  3.6 cm, closed
IMPRESSION: 1. Single live intrauterine pregnancy as above, estimated age 24
weeks and 6 days based on today's ultrasound.
2. Normal appearance of the fetal profile, completing the anatomic
survey begun previously. No fetal anomalies identified on today's
exam.

## 2022-10-13 ENCOUNTER — Encounter: Payer: Self-pay | Admitting: Licensed Practical Nurse

## 2022-10-13 ENCOUNTER — Ambulatory Visit (INDEPENDENT_AMBULATORY_CARE_PROVIDER_SITE_OTHER): Payer: BC Managed Care – PPO | Admitting: Licensed Practical Nurse

## 2022-10-13 DIAGNOSIS — E049 Nontoxic goiter, unspecified: Secondary | ICD-10-CM

## 2022-10-13 DIAGNOSIS — Z13220 Encounter for screening for lipoid disorders: Secondary | ICD-10-CM

## 2022-10-13 NOTE — Progress Notes (Signed)
Postpartum Visit  Chief Complaint:  Chief Complaint  Patient presents with  . Postpartum Care    Patient reports that she feels well today and states that she is using OCP as contraception. Patient also reports that she has been experiencing RUQ pain for the past week that she states is intermittent and sharp.     History of Present Illness: Patient is a 31 y.o. G1P1001 presents for postpartum visit.  Date of delivery: *** Type of delivery: Vaginal delivery - Vacuum or forceps assisted  {yes/no:63} Episiotomy No.  Laceration: {yes/no:63}  Pregnancy or labor problems:  {yes/no:63} Any problems since the delivery:  {yes/no:63}  Newborn Details:  SINGLETON :  1. Baby's name: ***. Birth weight: *** Maternal Details:  Breast Feeding:  {yes/no:63} Post partum depression/anxiety noted:  {yes/no:63} Edinburgh Post-Partum Depression Score:  {NUMBERS:20191}  Date of last PAP: ***  {norm/abn:16337}   Past Medical History:  Diagnosis Date  . Allergic rhinitis   . IBS (irritable bowel syndrome)   . Lactose intolerance    symptamatic but not diagnosed  . Medical history non-contributory   . Rash     Past Surgical History:  Procedure Laterality Date  . MOUTH SURGERY     x 3  . TONSILLECTOMY    . WISDOM TOOTH EXTRACTION      Prior to Admission medications   Medication Sig Start Date End Date Taking? Authorizing Provider  glycopyrrolate (ROBINUL) 1 MG tablet TAKE 1 TABLET(1 MG) BY MOUTH TWICE DAILY 01/21/22  Yes Ladene Artist, MD  levocetirizine (XYZAL) 5 MG tablet Take 5 mg by mouth daily.   Yes [provider]  levonorgestrel-ethinyl estradiol (ALESSE) 0.1-20 MG-MCG tablet Take 1 tablet by mouth daily. 10/19/22  Yes Cranston Koors, Nunzio Cobbs, CNM  VITAMIN D PO Take by mouth.   Yes [provider]    Allergies  Allergen Reactions  . Doxylamine Other (See Comments)  . Adhesive [Tape]   . Other     Nickle, gold, stainless steel  . Sulfa Antibiotics   .  Nickel Rash     Social History   Socioeconomic History  . Marital status: Married    Spouse name: Not on file  . Number of children: 0  . Years of education: Not on file  . Highest education level: Not on file  Occupational History  . Occupation: Pharmacist, hospital  Tobacco Use  . Smoking status: Never  . Smokeless tobacco: Never  Vaping Use  . Vaping Use: Never used  Substance and Sexual Activity  . Alcohol use: Not Currently    Comment: social  . Drug use: No  . Sexual activity: Yes    Partners: Male    Birth control/protection: Pill  Other Topics Concern  . Not on file  Social History Narrative  . Not on file   Social Determinants of Health   Financial Resource Strain: Not on file  Food Insecurity: No Food Insecurity (09/02/2022)   Hunger Vital Sign   . Worried About Charity fundraiser in the Last Year: Never true   . Ran Out of Food in the Last Year: Never true  Transportation Needs: No Transportation Needs (09/02/2022)   PRAPARE - Transportation   . Lack of Transportation (Medical): No   . Lack of Transportation (Non-Medical): No  Physical Activity: Insufficiently Active (12/30/2017)   Exercise Vital Sign   . Days of Exercise per Week: 2 days   . Minutes of Exercise per Session: 40 min  Stress: Stress Concern Present (  12/30/2017)   [place] [employer] - Occupational Stress Questionnaire   . Feeling of Stress : To some extent  Social Connections: Moderately Integrated (12/30/2017)   Social Connection and Isolation Panel [NHANES]   . Frequency of Communication with Friends and Family: More than three times a week   . Frequency of Social Gatherings with Friends and Family: Once a week   . Attends Religious Services: More than 4 times per year   . Active Member of Clubs or Organizations: Yes   . Attends Banker Meetings: More than 4 times per year   . Marital Status: Never married  Intimate Partner Violence: Not At Risk (12/30/2017)    Humiliation, Afraid, Rape, and Kick questionnaire   . Fear of Current or Ex-Partner: No   . Emotionally Abused: No   . Physically Abused: No   . Sexually Abused: No    Family History  Problem Relation Age of Onset  . Non-Hodgkin's lymphoma Father   . Colon polyps Father   . Lymphoma Father   . Breast cancer Paternal Grandmother 65  . Colon polyps Mother   . Irritable bowel syndrome Mother   . GER disease Brother   . Dementia Maternal Grandmother   . Heart disease Maternal Grandfather   . Diabetes Paternal Grandfather   . Diverticulitis Paternal Grandfather   . Breast cancer Paternal Aunt 34       "gene neg"    ROS   Physical Exam BP 135/73   Pulse 72   Resp 16   Ht 5\' 3"  (1.6 m)   Wt 175 lb 9.6 oz (79.7 kg)   Breastfeeding Yes   BMI 31.11 kg/m   OBGyn Exam   Female Chaperone present during breast and/or pelvic exam.  Assessment: 31 y.o. G1P1001 presenting for 6 week postpartum visit  Plan: Problem List Items Addressed This Visit   None    1) Contraception Education given regarding options for contraception, including {contraceptive options (MU measure 33):20677}.  2)  Pap - ASCCP guidelines and rational discussed.  Patient opts for *** screening interval  3) Patient underwent screening for postpartum depression with *** concerns noted.  4) Follow up 1 year for routine annual exam  38, MD 10/13/2022 11:41 AM

## 2022-10-14 LAB — LIPID PANEL
Chol/HDL Ratio: 3.2 ratio (ref 0.0–4.4)
Cholesterol, Total: 153 mg/dL (ref 100–199)
HDL: 48 mg/dL (ref >39)
LDL Chol Calc (NIH): 79 mg/dL (ref 0–99)
Triglycerides: 153 mg/dL — ABNORMAL HIGH (ref 0–149)
VLDL Cholesterol Cal: 26 mg/dL (ref 5–40)

## 2022-10-14 LAB — TSH+FREE T4
Free T4: 2.01 ng/dL — ABNORMAL HIGH (ref 0.82–1.77)
TSH: 0.041 u[IU]/mL — ABNORMAL LOW (ref 0.450–4.500)

## 2022-10-23 ENCOUNTER — Encounter: Payer: Self-pay | Admitting: Licensed Practical Nurse

## 2022-10-23 ENCOUNTER — Other Ambulatory Visit: Payer: Self-pay | Admitting: Licensed Practical Nurse

## 2022-10-23 DIAGNOSIS — R7989 Other specified abnormal findings of blood chemistry: Secondary | ICD-10-CM

## 2022-10-23 NOTE — Progress Notes (Signed)
Order placed for pt to return in about 4 wks for repeat thyroid labs [place]  10/23/22  5:28 PM

## 2022-11-20 ENCOUNTER — Other Ambulatory Visit: Payer: BC Managed Care – PPO

## 2022-11-20 DIAGNOSIS — R7989 Other specified abnormal findings of blood chemistry: Secondary | ICD-10-CM

## 2022-11-21 ENCOUNTER — Other Ambulatory Visit: Payer: Self-pay | Admitting: Licensed Practical Nurse

## 2022-11-21 DIAGNOSIS — E0590 Thyrotoxicosis, unspecified without thyrotoxic crisis or storm: Secondary | ICD-10-CM

## 2022-11-21 LAB — TSH+FREE T4
Free T4: 2.56 ng/dL — ABNORMAL HIGH (ref 0.82–1.77)
TSH: <0.005 u[IU]/mL — ABNORMAL LOW (ref 0.450–4.500)

## 2022-11-21 NOTE — Progress Notes (Signed)
Pt's thyroid slightly enlarged on exam on 10/23 asymptomatic at the time, TSH 0.041 T4 2.01, repeat on 11/30 TSH <0.005 T4 2.01 Referral to endo placed Carie Caddy, CNM  [facility]  11/21/22  9:34 AM

## 2022-12-03 ENCOUNTER — Ambulatory Visit: Payer: BC Managed Care – PPO | Admitting: Internal Medicine

## 2022-12-03 ENCOUNTER — Encounter: Payer: Self-pay | Admitting: Internal Medicine

## 2022-12-03 VITALS — BP 122/72 | HR 76 | Ht 63.0 in | Wt 172.2 lb

## 2022-12-03 DIAGNOSIS — E0590 Thyrotoxicosis, unspecified without thyrotoxic crisis or storm: Secondary | ICD-10-CM | POA: Diagnosis not present

## 2022-12-03 NOTE — Progress Notes (Unsigned)
Name: Janet Gilmore  MRN/ DOB: [id number], 1991-08-22    Age/ Sex: 31 y.o., female    PCP: Carylon Perches, MD   Reason for Endocrinology Evaluation: hyperthyroidism     Date of Initial Endocrinology Evaluation: 12/03/2022     HPI: Janet Gilmore is a 31 y.o. female with unremarkable  past medical history . The patient presented for initial endocrinology clinic visit on 12/03/2022 for consultative assistance with her Hyperthyroidism.   Pt has been noted with hyperthyroidism in 09/2022 with a TSH 0.41uIU/mL and elevated FT4 2.01 ng/dL on 53/61/4431  Of note, the pt is S/P delivery 09/03/2022 boy , she is G1P1  She is not nursing  She was noted with local neck swelling through her Gyn   Pt has been noted with weight loss  after delivery  She has noted mood swings   Has noted heat intolerance  Denies tremors  Denies burning and itching of the eyes   She is on OCP   Mother with goiter but normal TFT's   HISTORY:  Past Medical History:  Past Medical History:  Diagnosis Date   Allergic rhinitis    IBS (irritable bowel syndrome)    Lactose intolerance    symptamatic but not diagnosed   Medical history non-contributory    Rash    Past Surgical History:  Past Surgical History:  Procedure Laterality Date   MOUTH SURGERY     x 3   TONSILLECTOMY     WISDOM TOOTH EXTRACTION      Social History:  reports that she has never smoked. She has never used smokeless tobacco. She reports that she does not currently use alcohol. She reports that she does not use drugs. Family History: family history includes Breast cancer (age of onset: 50) in her paternal aunt; Breast cancer (age of onset: 65) in her paternal grandmother; Colon polyps in her father and mother; Dementia in her maternal grandmother; Diabetes in her paternal grandfather; Diverticulitis in her paternal grandfather; GER disease in her brother; Heart disease in her maternal grandfather; Irritable bowel syndrome in  her mother; Lymphoma in her father; Non-Hodgkin's lymphoma in her father.   HOME MEDICATIONS: Allergies as of 12/03/2022       Reactions   Doxylamine Other (See Comments)   Adhesive [tape]    Other    Nickle, gold, stainless steel   Sulfa Antibiotics    Nickel Rash        Medication List        Accurate as of December 03, 2022  3:05 PM. If you have any questions, ask your nurse or doctor.          glycopyrrolate 1 MG tablet Commonly known as: ROBINUL TAKE 1 TABLET(1 MG) BY MOUTH TWICE DAILY   levocetirizine 5 MG tablet Commonly known as: XYZAL Take 5 mg by mouth daily.   levonorgestrel-ethinyl estradiol 0.1-20 MG-MCG tablet Commonly known as: ALESSE Take 1 tablet by mouth daily.   VITAMIN D PO Take by mouth.          REVIEW OF SYSTEMS: A comprehensive ROS was conducted with the patient and is negative except as per HPI and below:  ROS     OBJECTIVE:  VS: BP 122/72 (BP Location: Left Arm, Patient Position: Sitting, Cuff Size: Large)   Pulse 76   Ht 5\' 3"  (1.6 m)   Wt 172 lb 3.2 oz (78.1 kg)   SpO2 98%   BMI 30.50 kg/m    Wt  Readings from Last 3 Encounters:  12/03/22 172 lb 3.2 oz (78.1 kg)  10/13/22 175 lb 9.6 oz (79.7 kg)  09/16/22 174 lb (78.9 kg)     EXAM: General: Pt appears well and is in NAD  Eyes: External eye exam normal without stare, lid lag or exophthalmos.  EOM intact.  PERRL.  Neck: General: Supple without adenopathy. Thyroid: Thyroid size normal.  No goiter or nodules appreciated. No thyroid bruit.  Lungs: Clear with good BS bilat with no rales, rhonchi, or wheezes  Heart: Auscultation: RRR.  Abdomen: Normoactive bowel sounds, soft, nontender, without masses or organomegaly palpable  Extremities:  BL LE: No pretibial edema normal ROM and strength.  Mental Status: Judgment, insight: Intact Orientation: Oriented to time, place, and person Mood and affect: No depression, anxiety, or agitation     DATA REVIEWED: ***     ASSESSMENT/PLAN/RECOMMENDATIONS:   Hyperthyroidism:    Medications :  Signed electronically by: Lyndle Herrlich, MD  [facility]  [facility] [address] [place], [place] [address] Phone: [phone] FAX: [phone]   CC: Carylon Perches, MD [address] [place] [address] Phone: [phone] Fax: [phone]   Return to Endocrinology clinic as below: No future appointments.

## 2022-12-04 LAB — T4, FREE: Free T4: 0.8 ng/dL (ref 0.60–1.60)

## 2022-12-04 LAB — TSH: TSH: 0.01 u[IU]/mL — ABNORMAL LOW (ref 0.35–5.50)

## 2022-12-05 ENCOUNTER — Encounter: Payer: Self-pay | Admitting: Internal Medicine

## 2022-12-05 LAB — T3: T3, Total: 134 ng/dL (ref 76–181)

## 2022-12-05 LAB — TRAB (TSH RECEPTOR BINDING ANTIBODY): TRAB: <1 IU/L (ref <=2.00)

## 2023-02-04 ENCOUNTER — Encounter: Payer: Self-pay | Admitting: Licensed Practical Nurse

## 2023-02-04 ENCOUNTER — Ambulatory Visit: Payer: BC Managed Care – PPO | Admitting: Licensed Practical Nurse

## 2023-02-04 VITALS — BP 129/73 | HR 77 | Ht 63.0 in | Wt 172.5 lb

## 2023-02-04 DIAGNOSIS — R454 Irritability and anger: Secondary | ICD-10-CM

## 2023-02-04 MED ORDER — FLUOXETINE HCL 10 MG PO CAPS: 10.0000 mg | ORAL_CAPSULE | Freq: Every day | ORAL | 1 refills | Status: DC

## 2023-02-04 NOTE — Progress Notes (Signed)
SUBJECTIVE: Here to discuss Mood: Janet Gilmore reports feeling "rage" for the last month, she feels like it has been there since birth (Vacuum assisted birth 9/12) but has gotten worse over the last month. 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 She feels this rage is "unpredictable", she feels "disconnected at work", she describes work as Furniture conservator/restorer, over the past 2 weeks: when her husband initiates sex she is not interested as she feels over stimulated-once she complied and the sex was not good.  Her partner is loving and supportive, he does not force sex. Her sleep is not the best.  Since December her infant requires more attention at night, she and her husband share the burden of night duty-but he does work 24-48 hour shifts. She does work out daily for 30 minutes, this makes her feel good. She had abnormal thyroid labs, has fu this month with endo. Janet Gilmore wonders if the rage is related to the hormones in her birth control. She is open to all treatment options. She has been talking to friends and they have recommended therapy and some are on medication and have given her reassurance of trying these options. They do have support from both sets of parents, she able to reach out to them to assist with childcare so that she can rest.   OBJECTIVE: BP 129/73   Pulse 77   Ht 5' 3"$  (1.6 m)   Wt 172 lb 8 oz (78.2 kg)   LMP 01/29/2023 (Exact Date)   Breastfeeding No   BMI 30.56 kg/m  GEN: well groomed  PE not necessary for purpose of visit  PHQ-9 9 GAD-7 11  ASSESSMENT: Postpartum Irritability   PLAN: -Continue open dialogue with partner, you two are doing a great job navigating the newborn period. Reach out to family for support.  Continue exercise daily.  -reviewed counseling: pt has a list of options, she will reach out -will start Prozac 51m daily, return in 1 month follow up -Contraception: ok to stop OCP, reviewed non hormonal methods including ParaGard, diaphragm, phexxi, condoms and fertility awareness.  Pt seemed most interested on condoms and maybe a Paragard.   LRoberto Scales [facility]  02/04/23  5:34 PM

## 2023-02-19 LAB — TSH: TSH: 2.82 u[IU]/mL (ref 0.450–4.500)

## 2023-02-19 LAB — T4, FREE: Free T4: 0.91 ng/dL (ref 0.82–1.77)

## 2023-03-02 ENCOUNTER — Ambulatory Visit: Payer: BC Managed Care – PPO | Admitting: Licensed Practical Nurse

## 2023-03-02 VITALS — BP 113/67 | HR 63 | Wt 168.9 lb

## 2023-03-02 DIAGNOSIS — R454 Irritability and anger: Secondary | ICD-10-CM

## 2023-03-02 DIAGNOSIS — Z79899 Other long term (current) drug therapy: Secondary | ICD-10-CM | POA: Diagnosis not present

## 2023-03-02 MED ORDER — FLUOXETINE HCL 10 MG PO CAPS: 10.0000 mg | ORAL_CAPSULE | Freq: Every day | ORAL | 12 refills | Status: DC

## 2023-03-02 NOTE — Progress Notes (Signed)
SUBJECTIVE: Here for medication follow up.  Was started on Prozac  '10mg'$  about 1 month ago for irritability. Pt reports she feels much better since starting medication, she feels more like herself. Things that used to make her upset easily she now don't.  She is sleep much better too. Now that her mood is improved, she desires to restart her birth control pills.   OBJECTIVE: BP 113/67   Pulse 63   Wt 168 lb 14.4 oz (76.6 kg)   LMP 02/27/2023 (Exact Date)   BMI 29.92 kg/m  GEN: NAD, pt smiling and appear happy during visit.   ASSESSMENT: Irritable mood.   PLAN: Will continue on Prozac for at least 6 months.   RTC in 306 months for follow up.   Start OCP's previously prescribed.   Roberto Scales, [facility]  03/02/23  7:39 PM

## 2023-04-01 ENCOUNTER — Ambulatory Visit: Payer: BC Managed Care – PPO | Admitting: Gastroenterology

## 2023-04-01 ENCOUNTER — Encounter: Payer: Self-pay | Admitting: Gastroenterology

## 2023-04-01 VITALS — BP 108/76 | HR 66 | Ht 63.0 in | Wt 167.4 lb

## 2023-04-01 DIAGNOSIS — K580 Irritable bowel syndrome with diarrhea: Secondary | ICD-10-CM | POA: Diagnosis not present

## 2023-04-01 DIAGNOSIS — R152 Fecal urgency: Secondary | ICD-10-CM

## 2023-04-01 MED ORDER — GLYCOPYRROLATE 2 MG PO TABS: 2.0000 mg | ORAL_TABLET | Freq: Two times a day (BID) | ORAL | 3 refills | Status: DC

## 2023-04-01 MED ORDER — NA SULFATE-K SULFATE-MG SULF 17.5-3.13-1.6 GM/177ML PO SOLN: 1.0000 | Freq: Once | ORAL | 0 refills | Status: AC

## 2023-04-01 NOTE — Patient Instructions (Signed)
We have sent the following medications to your pharmacy for you to pick up at your convenience: glycopyrrolate.   You have been scheduled for a colonoscopy. Please follow written instructions given to you at your visit today.  Please pick up your prep supplies at the pharmacy within the next 1-3 days. If you use inhalers (even only as needed), please bring them with you on the day of your procedure.  The [place] [facility] providers would like to encourage you to use [facility] to communicate with providers for non-urgent requests or questions.  Due to long hold times on the telephone, sending your provider a message by [facility] may be a faster and more efficient way to get a response.  Please allow 48 business hours for a response.  Please remember that this is for non-urgent requests.   Due to recent changes in healthcare laws, you may see the results of your imaging and laboratory studies on MyChart before your provider has had a chance to review them.  We understand that in some cases there may be results that are confusing or concerning to you. Not all laboratory results come back in the same time frame and the provider may be waiting for multiple results in order to interpret others.  Please give [place] 48 hours in order for your provider to thoroughly review all the results before contacting the office for clarification of your results.   Thank you for choosing me and [facility].  Venita Lick. Pleas Koch., MD., [facility]

## 2023-04-01 NOTE — Progress Notes (Signed)
     Assessment     IBS-D, fecal urgency, occasional fecal incontinence. R/O IBD, microscopic colitis, neoplasm   Recommendations    Increase glycopyrrolate to 2 mg bid Continue hyoscyamine 0.125 mg q4h prn Imodium 1-2 tid prn Review low FODMAP diet again Schedule colonoscopy. The risks (including bleeding, perforation, infection, missed lesions, medication reactions and possible hospitalization or surgery if complications occur), benefits, and alternatives to colonoscopy with possible biopsy and possible polypectomy were discussed with the patient and they consent to proceed.     HPI    This is a 32 year old female returning for follow-up of frequent urgent diarrhea.  She now relates episodes of intermittent, infrequent fecal incontinence.  She has continue glycopyrrolate 1 mg twice daily.  Her PCP had previously supplied hyoscyamine.  In addition she takes Imodium as needed.  No other gastrointestinal complaints.  Denies weight loss, abdominal pain, constipation,  change in stool caliber, melena, hematochezia, nausea, vomiting, dysphagia, reflux symptoms, chest pain.    Labs / Imaging       Latest Ref Rng & Units 03/10/2018    9:58 AM  Hepatic Function  Total Protein 6.5 - 8.1 g/dL 8.2   Albumin 3.5 - 5.0 g/dL 4.7   AST 15 - 41 U/L 24   ALT 14 - 54 U/L 17   Alk Phosphatase 38 - 126 U/L 57   Total Bilirubin 0.3 - 1.2 mg/dL 0.6        Latest Ref Rng & Units 09/03/2022    5:06 AM 09/02/2022    4:10 AM 06/16/2022   10:39 AM  CBC  WBC 4.0 - 10.5 K/uL 17.8  18.3  12.0   Hemoglobin 12.0 - 15.0 g/dL 9.6  33.8  25.0   Hematocrit 36.0 - 46.0 % 29.3  38.0  37.5   Platelets 150 - 400 K/uL 250  300  317    Current Medications, Allergies, Past Medical History, Past Surgical History, Family History and Social History were reviewed in [employer] record.   Physical Exam: General: Well developed, well nourished, no acute distress Head: Normocephalic and  atraumatic Eyes: Sclerae anicteric, EOMI Ears: Normal auditory acuity Mouth: No deformities or lesions noted Lungs: Clear throughout to auscultation Heart: Regular rate and rhythm; No murmurs, rubs or bruits Abdomen: Soft, non tender and non distended. No masses, hepatosplenomegaly or hernias noted. Normal Bowel sounds Rectal: Deferred to colonoscopy Musculoskeletal: Symmetrical with no gross deformities  Pulses:  Normal pulses noted Extremities: No edema or deformities noted Neurological: Alert oriented x 4, grossly nonfocal Psychological:  Alert and cooperative. Normal mood and affect   Sheldon Sem T. Russella Dar, MD 04/01/2023, 9:41 AM

## 2023-04-03 ENCOUNTER — Encounter: Payer: Self-pay | Admitting: Gastroenterology

## 2023-04-14 ENCOUNTER — Encounter: Payer: BC Managed Care – PPO | Admitting: Gastroenterology

## 2023-04-14 ENCOUNTER — Telehealth: Payer: Self-pay | Admitting: Gastroenterology

## 2023-04-14 NOTE — Telephone Encounter (Signed)
Patient is calling wanting to know if she would still be able to have her procedure tomorrow given that she is having some congestion. Please advise

## 2023-04-14 NOTE — Telephone Encounter (Signed)
Pt stated that she started having some congestion this morning. Stated that she has had a dry cough all day today. She denies any fever, chills, chest pain, and had not had any difficulty breathing today. She also took an at home covid test, and results were negative. Spoke with CRNA, and advised patient that she could proceed with procedure as long as that is what she wants to do since she is afebrile. Advised patient to monitor her cough overnight, and to call [facility] in the morning if she spiked a fever overnight. Pt verbalized understanding, and had no further concerns at the end of the call.

## 2023-04-14 NOTE — Telephone Encounter (Signed)
LM on VM for patient to call back.  ?

## 2023-04-15 ENCOUNTER — Ambulatory Visit (AMBULATORY_SURGERY_CENTER): Payer: BC Managed Care – PPO | Admitting: Gastroenterology

## 2023-04-15 ENCOUNTER — Encounter: Payer: Self-pay | Admitting: Gastroenterology

## 2023-04-15 VITALS — BP 96/45 | HR 59 | Temp 97.8°F | Resp 18 | Ht 63.0 in | Wt 167.0 lb

## 2023-04-15 DIAGNOSIS — D123 Benign neoplasm of transverse colon: Secondary | ICD-10-CM

## 2023-04-15 DIAGNOSIS — R197 Diarrhea, unspecified: Secondary | ICD-10-CM

## 2023-04-15 DIAGNOSIS — K580 Irritable bowel syndrome with diarrhea: Secondary | ICD-10-CM

## 2023-04-15 MED ORDER — SODIUM CHLORIDE 0.9 % IV SOLN: 500.0000 mL | Freq: Once | INTRAVENOUS | Status: AC

## 2023-04-15 NOTE — Progress Notes (Signed)
See 04/01/2023 H&P, no changes 

## 2023-04-15 NOTE — Progress Notes (Signed)
Sedate, gd SR, tolerated procedure well, VSS, report to RN 

## 2023-04-15 NOTE — Progress Notes (Signed)
Called to room to assist during endoscopic procedure.  Patient ID and intended procedure confirmed with present staff. Received instructions for my participation in the procedure from the performing physician.  

## 2023-04-15 NOTE — Progress Notes (Signed)
VS completed by DT.  Pt's states no medical or surgical changes since previsit or office visit.  

## 2023-04-15 NOTE — Patient Instructions (Signed)
YOU HAD AN ENDOSCOPIC PROCEDURE TODAY AT THE [facility]:   Refer to the procedure report that was given to you for any specific questions about what was found during the examination.  If the procedure report does not answer your questions, please call your gastroenterologist to clarify.  If you requested that your care partner not be given the details of your procedure findings, then the procedure report has been included in a sealed envelope for you to review at your convenience later.  YOU SHOULD EXPECT: Some feelings of bloating in the abdomen. Passage of more gas than usual.  Walking can help get rid of the air that was put into your GI tract during the procedure and reduce the bloating. If you had a lower endoscopy (such as a colonoscopy or flexible sigmoidoscopy) you may notice spotting of blood in your stool or on the toilet paper. If you underwent a bowel prep for your procedure, you may not have a normal bowel movement for a few days.  Please Note:  You might notice some irritation and congestion in your nose or some drainage.  This is from the oxygen used during your procedure.  There is no need for concern and it should clear up in a day or so.  SYMPTOMS TO REPORT IMMEDIATELY:  Following lower endoscopy (colonoscopy or flexible sigmoidoscopy):  Excessive amounts of blood in the stool  Significant tenderness or worsening of abdominal pains  Swelling of the abdomen that is new, acute  Fever of 100F or higher   For urgent or emergent issues, a gastroenterologist can be reached at any hour by calling [phone]. Do not use MyChart messaging for urgent concerns.    DIET:  We do recommend a small meal at first, but then you may proceed to your regular diet.  Drink plenty of fluids but you should avoid alcoholic beverages for 24 hours.  MEDICATIONS: Continue present medications.  Please see handouts given to you by your recovery nurse: Polyps.  FOLLOW UP: Await  pathology results. Repeat surveillance colonoscopy date will be determined after pathology results have been reviewed.  Thank you for allowing [place] to provide for your healthcare needs today.  ACTIVITY:  You should plan to take it easy for the rest of today and you should NOT DRIVE or use heavy machinery until tomorrow (because of the sedation medicines used during the test).    FOLLOW UP: Our staff will call the number listed on your records the next business day following your procedure.  We will call around 7:15- 8:00 am to check on you and address any questions or concerns that you may have regarding the information given to you following your procedure. If we do not reach you, we will leave a message.     If any biopsies were taken you will be contacted by phone or by letter within the next 1-3 weeks.  Please call us at [phone] if you have not heard about the biopsies in 3 weeks.    SIGNATURES/CONFIDENTIALITY: You and/or your care partner have signed paperwork which will be entered into your electronic medical record.  These signatures attest to the fact that that the information above on your After Visit Summary has been reviewed and is understood.  Full responsibility of the confidentiality of this discharge information lies with you and/or your care-partner.

## 2023-04-15 NOTE — Op Note (Signed)
[facility] Patient Name: Janet Gilmore Procedure Date: 04/15/2023 8:36 AM MRN: [id number] Endoscopist: Meryl Dare , MD, [phone] Age: 32 Referring MD:  Date of Birth: 30-Aug-1991 Gender: Female Account #: [id number] Procedure:                Colonoscopy Indications:              Clinically significant diarrhea of unexplained                            origin Medicines:                Monitored Anesthesia Care Procedure:                Pre-Anesthesia Assessment:                           - Prior to the procedure, a History and Physical                            was performed, and patient medications and                            allergies were reviewed. The patient's tolerance of                            previous anesthesia was also reviewed. The risks                            and benefits of the procedure and the sedation                            options and risks were discussed with the patient.                            All questions were answered, and informed consent                            was obtained. Prior Anticoagulants: The patient has                            taken no anticoagulant or antiplatelet agents. ASA                            Grade Assessment: II - A patient with mild systemic                            disease. After reviewing the risks and benefits,                            the patient was deemed in satisfactory condition to                            undergo the procedure.  After obtaining informed consent, the colonoscope                            was passed under direct vision. Throughout the                            procedure, the patient's blood pressure, pulse, and                            oxygen saturations were monitored continuously. The                            Olympus PCF-[id number]) Colonoscope was                            introduced through the anus and advanced to the the                             terminal ileum, with identification of the                            appendiceal orifice and IC valve. The terminal                            ileum, ileocecal valve, appendiceal orifice, and                            rectum were photographed. The quality of the bowel                            preparation was excellent. The colonoscopy was                            performed without difficulty. The patient tolerated                            the procedure well. Scope In: 8:40:14 AM Scope Out: 8:57:01 AM Scope Withdrawal Time: 0 hours 14 minutes 58 seconds  Total Procedure Duration: 0 hours 16 minutes 47 seconds  Findings:                 The perianal and digital rectal examinations were                            normal.                           A 6 mm polyp was found in the transverse colon. The                            polyp was sessile. The polyp was removed with a                            cold snare. Resection and retrieval were complete.  The exam was otherwise without abnormality on                            direct and retroflexion views. Random biopsies                            throughout the colon.                           The terminal ileum appeared normal. Complications:            No immediate complications. Estimated blood loss:                            None. Estimated Blood Loss:     Estimated blood loss: none. Impression:               - The examined portion of the ileum was normal.                           - 6 mm sessile transverse colon polyp. Removed and                            retrieved.                           - The entire examined colon was otherwise normal on                            direct and retroflexion views. Biopsied. Recommendation:           - Repeat colonoscopy timing pending pathology                            review.                           - Patient has a contact number  available for                            emergencies. The signs and symptoms of potential                            delayed complications were discussed with the                            patient. Return to normal activities tomorrow.                            Written discharge instructions were provided to the                            patient.                           - Resume previous diet.                           -  Continue present medications.                           - Await pathology results. Meryl Dare, MD 04/15/2023 9:03:41 AM This report has been signed electronically.

## 2023-04-16 ENCOUNTER — Telehealth: Payer: Self-pay

## 2023-04-16 NOTE — Telephone Encounter (Signed)
  Follow up Call-     04/15/2023    7:41 AM  Call back number  Post procedure Call Back phone  # [phone]  Permission to leave phone message Yes     Patient questions:  Do you have a fever, pain , or abdominal swelling? No. Pain Score  0 *  Have you tolerated food without any problems? Yes.    Have you been able to return to your normal activities? Yes.    Do you have any questions about your discharge instructions: Diet   No. Medications  No. Follow up visit  No.  Do you have questions or concerns about your Care? No.  Actions: * If pain score is 4 or above: No action needed, pain <4.

## 2023-04-22 ENCOUNTER — Encounter: Payer: Self-pay | Admitting: Gastroenterology

## 2023-05-05 ENCOUNTER — Encounter: Payer: Self-pay | Admitting: Internal Medicine

## 2023-05-05 ENCOUNTER — Ambulatory Visit: Payer: BC Managed Care – PPO | Admitting: Internal Medicine

## 2023-05-05 VITALS — BP 120/80 | HR 68 | Ht 63.0 in | Wt 167.2 lb

## 2023-05-05 DIAGNOSIS — E069 Thyroiditis, unspecified: Secondary | ICD-10-CM

## 2023-05-05 NOTE — Progress Notes (Unsigned)
Name: Janet Gilmore  MRN/ DOB: [id number], 1991/01/04    Age/ Sex: 32 y.o., female    PCP: Carylon Perches, MD   Reason for Endocrinology Evaluation: hyperthyroidism     Date of Initial Endocrinology Evaluation: 12/03/2022    HPI: Janet Gilmore is a 32 y.o. female with unremarkable  past medical history . The patient presented for initial endocrinology clinic visit on 12/03/2022 for consultative assistance with her Hyperthyroidism.   Pt has been noted with hyperthyroidism in 09/2022 with a TSH 0.41uIU/mL and elevated FT4 2.01 ng/dL on 40/98/1191  Of note, the pt is S/P delivery 09/03/2022 boy , she is G1P1  TRAb was undetectable 04/2022  Clinical scenario was consistent with painless thyroiditis that resolved spontaneously   Mother with goiter but normal TFTs SUBJECTIVE:    Today (05/05/23): Ms. 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   Weight has been decreasing  Denies local neck swelling  She has been on Prozac for irritability  She has IBS  NO tremors  No palpitations   HISTORY:  Past Medical History:  Past Medical History:  Diagnosis Date   Allergic rhinitis    IBS (irritable bowel syndrome)    Lactose intolerance    symptamatic but not diagnosed   Medical history non-contributory    Rash    Past Surgical History:  Past Surgical History:  Procedure Laterality Date   MOUTH SURGERY     x 3   TONSILLECTOMY     UPPER GASTROINTESTINAL ENDOSCOPY     WISDOM TOOTH EXTRACTION      Social History:  reports that she has never smoked. She has never used smokeless tobacco. She reports that she does not currently use alcohol. She reports that she does not use drugs. Family History: family history includes Breast cancer (age of onset: 9) in her paternal aunt; Breast cancer (age of onset: 89) in her paternal grandmother; Colon polyps in her father and mother; Dementia in her maternal grandmother; Diabetes in her paternal grandfather;  Diverticulitis in her paternal grandfather; GER disease in her brother; Heart disease in her maternal grandfather; Irritable bowel syndrome in her mother; Lymphoma in her father; Non-Hodgkin's lymphoma in her father.   HOME MEDICATIONS: Allergies as of 05/05/2023       Reactions   Doxylamine Other (See Comments)   Adhesive [tape]    Other    Nickle, gold, stainless steel   Sulfa Antibiotics    Nickel Rash        Medication List        Accurate as of May 05, 2023 12:37 PM. If you have any questions, ask your nurse or doctor.          FLUoxetine 10 MG capsule Commonly known as: PROzac Take 1 capsule (10 mg total) by mouth daily.   glycopyrrolate 2 MG tablet Commonly known as: ROBINUL Take 1 tablet (2 mg total) by mouth 2 (two) times daily.   levocetirizine 5 MG tablet Commonly known as: XYZAL Take 5 mg by mouth daily.   levonorgestrel-ethinyl estradiol 0.1-20 MG-MCG tablet Commonly known as: ALESSE Take 1 tablet by mouth daily.   VITAMIN D PO Take by mouth.          REVIEW OF SYSTEMS: A comprehensive ROS was conducted with the patient and is negative except as per HPI    OBJECTIVE:  VS: There were no vitals taken for this visit.   Wt Readings from Last 3 Encounters:  04/15/23  167 lb (75.8 kg)  04/01/23 167 lb 6.4 oz (75.9 kg)  03/02/23 168 lb 14.4 oz (76.6 kg)     EXAM: General: Pt appears well and is in NAD  Eyes: External eye exam normal without stare, lid lag or exophthalmos.  EOM intact.  PERRL.  Neck: General: Supple without adenopathy. Thyroid: Thyroid size normal.  No goiter or nodules appreciated. No thyroid bruit.  Lungs: Clear with good BS bilat with no rales, rhonchi, or wheezes  Heart: Auscultation: RRR.  Abdomen: Normoactive bowel sounds, soft, nontender, without masses or organomegaly palpable  Extremities:  BL LE: No pretibial edema normal ROM and strength.  Mental Status: Judgment, insight: Intact Orientation: Oriented to time,  place, and person Mood and affect: No depression, anxiety, or agitation     DATA REVIEWED:     Latest Reference Range & Units 12/03/22 15:32  TSH 0.35 - 5.50 uIU/mL 0.01 (L)  Triiodothyronine (T3) 76 - 181 ng/dL 161  W9,UEAV(WUJWJX) 9.14 - 1.60 ng/dL 7.82   TRAB pending    ASSESSMENT/PLAN/RECOMMENDATIONS:   Hyperthyroidism:  -Patient is clinically euthyroid -No local neck symptoms -We discussed differential diagnosis to include painless thyroiditis versus Graves' disease -Good news is that her T4 and T3 have normalized, TSH continues to be abnormal but this usually lags behind -No treatment will be offered at this time but we will continue to monitor, she will have labs repeated in 8 weeks -We also discussed the possibility of having hypothyroidism during the recovery process from painless thyroiditis  Follow-up in 4 months Attempted to call the patient on 12/04/2022 at 1330, the call went to voicemail a portal message was sent   Signed electronically by: Lyndle Herrlich, MD  [facility] Endocrinology  [facility] [address] [place] [place], [place] [address] Phone: [phone] FAX: [phone]   CC: Carylon Perches, MD [address] [place] [address] Phone: [phone] Fax: [phone]   Return to Endocrinology clinic as below: Future Appointments  Date Time Provider Department Center  05/05/2023  2:40 PM Aidan Moten, Konrad Dolores, MD [id number] None

## 2023-05-06 LAB — T4, FREE: Free T4: 0.79 ng/dL (ref 0.60–1.60)

## 2023-05-06 LAB — T3, FREE: T3, Free: 3.2 pg/mL (ref 2.3–4.2)

## 2023-05-06 LAB — TSH: TSH: 2.83 u[IU]/mL (ref 0.35–5.50)

## 2024-05-08 ENCOUNTER — Encounter: Payer: Self-pay | Admitting: Licensed Practical Nurse

## 2024-05-09 ENCOUNTER — Other Ambulatory Visit: Payer: Self-pay | Admitting: Licensed Practical Nurse

## 2024-05-09 DIAGNOSIS — R454 Irritability and anger: Secondary | ICD-10-CM

## 2024-05-09 MED ORDER — FLUOXETINE HCL 10 MG PO CAPS: 10.0000 mg | ORAL_CAPSULE | Freq: Every day | ORAL | 12 refills | Status: AC

## 2024-05-26 ENCOUNTER — Ambulatory Visit (INDEPENDENT_AMBULATORY_CARE_PROVIDER_SITE_OTHER): Payer: Self-pay | Admitting: Obstetrics and Gynecology

## 2024-05-26 ENCOUNTER — Encounter: Payer: Self-pay | Admitting: Obstetrics and Gynecology

## 2024-05-26 VITALS — BP 127/77 | HR 73 | Ht 63.0 in | Wt 174.0 lb

## 2024-05-26 DIAGNOSIS — Z01419 Encounter for gynecological examination (general) (routine) without abnormal findings: Secondary | ICD-10-CM

## 2024-05-26 DIAGNOSIS — L299 Pruritus, unspecified: Secondary | ICD-10-CM

## 2024-05-26 NOTE — Progress Notes (Signed)
 PCP:  Artemisa Bile, MD   Chief Complaint  Patient presents with   Gynecologic Exam    LB is really itchy, little swelling going on x 1 week     HPI:      [name] is a 33 y.o. G0P0000 who LMP was Patient's last menstrual period was 04/30/2024 (exact date)., presents today for her annual examination.  Her menses are regular every 28-30 days, lasting 5 days, mod flow.  Dysmenorrhea rarely, no BTB.  Sex activity: single partner, contraception - NFP/condoms/being careful. Declines BC.   Last Pap: 01/14/22 Results were: no abnormalities /neg HPV DNA Hx of STDs: none  There is a FH of breast cancer in her PGM and pat aunt, genetic testing not indicated for pt, although pt states pat aunt was "gene neg" several yrs ago. There is no FH of ovarian cancer. The patient does self-breast exams. Has had itching on LEFT breast the past wk or so; uses aveeno sensitive skin wash, no dryer sheets. Hx of itchy skin in general, worse in summer. No tx.   Tobacco use: The patient denies current or previous tobacco use. Alcohol use: social drinker No drug use.  Exercise: mod active  Colonoscopy: 7/24 with polyp, repeat due after 7 rys.   She does get adequate calcium and Vitamin D in her diet.   Past Medical History:  Diagnosis Date   Allergic rhinitis    IBS (irritable bowel syndrome)    Lactose intolerance    symptamatic but not diagnosed   Medical history non-contributory    Rash     Past Surgical History:  Procedure Laterality Date   MOUTH SURGERY     x 3   TONSILLECTOMY     UPPER GASTROINTESTINAL ENDOSCOPY     WISDOM TOOTH EXTRACTION      Family History  Problem Relation Age of Onset   Non-Hodgkin's lymphoma Father    Colon polyps Father    Lymphoma Father    Breast cancer Paternal Grandmother 31   Colon polyps Mother    Irritable bowel syndrome Mother    GER disease Brother    Dementia Maternal Grandmother    Heart disease Maternal Grandfather    Diabetes  Paternal Grandfather    Diverticulitis Paternal Grandfather    Breast cancer Paternal Aunt 71       "gene neg"    Social History   Socioeconomic History   Marital status: Married    Spouse name: Not on file   Number of children: 0   Years of education: Not on file   Highest education level: Not on file  Occupational History   Occupation: Runner, broadcasting/film/video  Tobacco Use   Smoking status: Never   Smokeless tobacco: Never  Vaping Use   Vaping status: Never Used  Substance and Sexual Activity   Alcohol use: Yes    Comment: social   Drug use: No   Sexual activity: Yes    Partners: Male    Birth control/protection: None  Other Topics Concern   Not on file  Social History Narrative   Not on file   Social Drivers of Health   Financial Resource Strain: Not on file  Food Insecurity: No Food Insecurity (09/02/2022)   Hunger Vital Sign    Worried About Running Out of Food in the Last Year: Never true    Ran Out of Food in the Last Year: Never true  Transportation Needs: No Transportation Needs (09/02/2022)   PRAPARE - Transportation  Lack of Transportation (Medical): No    Lack of Transportation (Non-Medical): No  Physical Activity: Insufficiently Active (12/30/2017)   Exercise Vital Sign    Days of Exercise per Week: 2 days    Minutes of Exercise per Session: 40 min  Stress: Stress Concern Present (12/30/2017)   [employer] of Occupational Health - Occupational Stress Questionnaire    Feeling of Stress : To some extent  Social Connections: Moderately Integrated (12/30/2017)   Social Connection and Isolation Panel [NHANES]    Frequency of Communication with Friends and Family: More than three times a week    Frequency of Social Gatherings with Friends and Family: Once a week    Attends Religious Services: More than 4 times per year    Active Member of [employer] or Organizations: Yes    Attends Banker Meetings: More than 4 times per year    Marital Status: Never married   Intimate Partner Violence: Not At Risk (12/30/2017)   Humiliation, Afraid, Rape, and Kick questionnaire    Fear of Current or Ex-Partner: No    Emotionally Abused: No    Physically Abused: No    Sexually Abused: No    Current Meds  Medication Sig   FLUoxetine  (PROZAC ) 10 MG capsule Take 1 capsule (10 mg total) by mouth daily.   glycopyrrolate  (ROBINUL ) 2 MG tablet Take 1 tablet (2 mg total) by mouth 2 (two) times daily.   levocetirizine (XYZAL) 5 MG tablet Take 5 mg by mouth daily.   VITAMIN D PO Take by mouth.   Current Facility-Administered Medications for the 05/26/24 encounter (Office Visit) with Jacquline Terrill B, PA-C  Medication   0.9 %  sodium chloride  infusion     ROS:  Review of Systems  Constitutional:  Negative for fatigue, fever and unexpected weight change.  Respiratory:  Negative for cough, shortness of breath and wheezing.   Cardiovascular:  Negative for chest pain, palpitations and leg swelling.  Gastrointestinal:  Negative for blood in stool, constipation, diarrhea, nausea and vomiting.  Endocrine: Negative for cold intolerance, heat intolerance and polyuria.  Genitourinary:  Negative for dyspareunia, dysuria, flank pain, frequency, genital sores, hematuria, menstrual problem, pelvic pain, urgency, vaginal bleeding, vaginal discharge and vaginal pain.  Musculoskeletal:  Negative for back pain, joint swelling and myalgias.  Skin:  Negative for rash.  Neurological:  Negative for dizziness, syncope, light-headedness, numbness and headaches.  Hematological:  Negative for adenopathy.  Psychiatric/Behavioral:  Negative for agitation, confusion, sleep disturbance and suicidal ideas. The patient is not nervous/anxious.      Objective: BP 127/77   Pulse 73   Ht 5\' 3"  (1.6 m)   Wt 174 lb (78.9 kg)   LMP 04/30/2024 (Exact Date)   Breastfeeding No   BMI 30.82 kg/m    Physical Exam Constitutional:      Appearance: She is well-developed.  Genitourinary:      Vulva normal.     Right Labia: No rash, tenderness or lesions.    Left Labia: No tenderness, lesions or rash.    No vaginal discharge, erythema or tenderness.      Right Adnexa: not tender and no mass present.    Left Adnexa: not tender and no mass present.    No cervical motion tenderness, friability or polyp.     Uterus is not enlarged or tender.  Breasts:    Right: No mass, nipple discharge, skin change or tenderness.     Left: Skin change present. No mass, nipple discharge or  tenderness.  Neck:     Thyroid : No thyromegaly.  Cardiovascular:     Rate and Rhythm: Normal rate and regular rhythm.     Heart sounds: Normal heart sounds. No murmur heard. Pulmonary:     Effort: Pulmonary effort is normal.     Breath sounds: Normal breath sounds.  Chest:    Abdominal:     Palpations: Abdomen is soft.     Tenderness: There is no abdominal tenderness. There is no guarding or rebound.  Musculoskeletal:        General: Normal range of motion.     Cervical back: Normal range of motion.  Lymphadenopathy:     Cervical: No cervical adenopathy.  Neurological:     General: No focal deficit present.     Mental Status: She is alert and oriented to person, place, and time.     Cranial Nerves: No cranial nerve deficit.  Skin:    General: Skin is warm and dry.  Psychiatric:        Mood and Affect: Mood normal.        Behavior: Behavior normal.        Thought Content: Thought content normal.        Judgment: Judgment normal.  Vitals reviewed.     Assessment/Plan: Encounter for annual routine gynecological examination  Itch of skin--neg exam except for few excoriations. D/c soap, apply sensitive skin lotion out of shower, OTC hydrocortisone crm for a few days. Stop itch/scratch phenomenon. Pt with hx of sensitive skin/itch in general. F/u prn.            GYN counsel adequate intake of calcium and vitamin D, diet and exercise     F/U  Return in about 1 year (around  05/26/2025).  Rache Klimaszewski B. Chozen Latulippe, PA-C 05/26/2024 4:53 PM

## 2024-05-26 NOTE — Patient Instructions (Signed)
 I value your feedback and you entrusting [place] with your care. If you get a [place] patient survey, I would appreciate you taking the time to let us know about your experience today. Thank you! ? ? ?

## 2024-07-10 ENCOUNTER — Emergency Department

## 2024-07-10 ENCOUNTER — Emergency Department
Admission: EM | Admit: 2024-07-10 | Discharge: 2024-07-10 | Disposition: A | Discharge status: Home / Self Care | Source: Other Acute Inpatient Hospital | Attending: Emergency Medicine | Admitting: Emergency Medicine

## 2024-07-10 ENCOUNTER — Other Ambulatory Visit: Payer: Self-pay

## 2024-07-10 DIAGNOSIS — R1031 Right lower quadrant pain: Secondary | ICD-10-CM | POA: Diagnosis present

## 2024-07-10 LAB — COMPREHENSIVE METABOLIC PANEL WITH GFR
ALT: 22 U/L (ref 0–44)
AST: 28 U/L (ref 15–41)
Albumin: 4.1 g/dL (ref 3.5–5.0)
Alkaline Phosphatase: 61 U/L (ref 38–126)
Anion gap: 11 (ref 5–15)
BUN: 12 mg/dL (ref 6–20)
CO2: 22 mmol/L (ref 22–32)
Calcium: 9.4 mg/dL (ref 8.9–10.3)
Chloride: 103 mmol/L (ref 98–111)
Creatinine, Ser: 0.91 mg/dL (ref 0.44–1.00)
GFR, Estimated: >60 mL/min (ref >60)
Glucose, Bld: 101 mg/dL — ABNORMAL HIGH (ref 70–99)
Potassium: 3.8 mmol/L (ref 3.5–5.1)
Sodium: 136 mmol/L (ref 135–145)
Total Bilirubin: 0.4 mg/dL (ref 0.0–1.2)
Total Protein: 7.7 g/dL (ref 6.5–8.1)

## 2024-07-10 LAB — URINALYSIS, ROUTINE W REFLEX MICROSCOPIC
Bilirubin Urine: NEGATIVE
Glucose, UA: NEGATIVE mg/dL
Hgb urine dipstick: NEGATIVE
Ketones, ur: NEGATIVE mg/dL
Nitrite: NEGATIVE
Protein, ur: NEGATIVE mg/dL
Specific Gravity, Urine: 1.011 (ref 1.005–1.030)
pH: 6 (ref 5.0–8.0)

## 2024-07-10 LAB — PREGNANCY, URINE: Preg Test, Ur: NEGATIVE

## 2024-07-10 LAB — CBC
HCT: 40.7 % (ref 36.0–46.0)
Hemoglobin: 13.6 g/dL (ref 12.0–15.0)
MCH: 29.2 pg (ref 26.0–34.0)
MCHC: 33.4 g/dL (ref 30.0–36.0)
MCV: 87.5 fL (ref 80.0–100.0)
Platelets: 307 K/uL (ref 150–400)
RBC: 4.65 MIL/uL (ref 3.87–5.11)
RDW: 12.7 % (ref 11.5–15.5)
WBC: 7.7 K/uL (ref 4.0–10.5)
nRBC: 0 % (ref 0.0–0.2)

## 2024-07-10 LAB — LIPASE, BLOOD: Lipase: 35 U/L (ref 11–51)

## 2024-07-10 MED ORDER — MORPHINE SULFATE (PF) 4 MG/ML IV SOLN: 4.0000 mg | Freq: Once | INTRAVENOUS | Status: DC

## 2024-07-10 MED ORDER — DICYCLOMINE HCL 10 MG PO CAPS: 10.0000 mg | ORAL_CAPSULE | Freq: Three times a day (TID) | ORAL | 0 refills | Status: DC

## 2024-07-10 MED ORDER — IOHEXOL 300 MG/ML  SOLN
100.0000 mL | Freq: Once | INTRAMUSCULAR | Status: AC | PRN
  Administered 2024-07-10: 100 mL via INTRAVENOUS

## 2024-07-10 NOTE — ED Notes (Signed)
 IV attempted . X 3 not successful Asked others in Dept to assist.

## 2024-07-10 NOTE — ED Triage Notes (Signed)
 To ED for diarrhea since 3 days, RUQ pain since 2 days. Hx IBS but usually no pain. This AM after eating her pain was really bad. Has GB and appendix. Started having pain also to RLQ today after 33 y/o son sat on her belly. Pain radiates to back.

## 2024-07-10 NOTE — ED Provider Notes (Signed)
 [facility] Provider Note    Event Date/Time   First MD Initiated Contact with Patient 07/10/24 1338     (approximate)   History   Abdominal Pain and Diarrhea   HPI  Janet Gilmore is a 33 y.o. female with PMH of IBS who presents for evaluation of abdominal pain and diarrhea. Endorses RLQ abdominal pain intermittient since Thursday that has worsened in severity and is consistent today. Patient states she can have bouts of diarrhea due to her IBS, but she is now having pain and that is abnormal for her. Pain began after breakfast today. Denies fever, vomiting. Endorses nausea. Took two pregnancy tests at home that were negative.       Physical Exam   Triage Vital Signs: ED Triage Vitals  Encounter Vitals Group     BP 07/10/24 1333 135/82     Girls Systolic BP Percentile --      Girls Diastolic BP Percentile --      Boys Systolic BP Percentile --      Boys Diastolic BP Percentile --      Pulse Rate 07/10/24 1333 78     Resp 07/10/24 1333 20     Temp 07/10/24 1333 98.4 F (36.9 C)     Temp Source 07/10/24 1333 Oral     SpO2 07/10/24 1333 98 %     Weight 07/10/24 1331 177 lb (80.3 kg)     Height 07/10/24 1331 5' 4 (1.626 m)     Head Circumference --      Peak Flow --      Pain Score 07/10/24 1328 6     Pain Loc --      Pain Education --      Exclude from Growth Chart --     Most recent vital signs: Vitals:   07/10/24 1333  BP: 135/82  Pulse: 78  Resp: 20  Temp: 98.4 F (36.9 C)  SpO2: 98%    General: Awake, no distress. Well appearing. CV:  Good peripheral perfusion. RRR. Resp:  Normal effort. CTAB. Abd:  No distention. TTP in RLQ.  Other:     ED Results / Procedures / Treatments   Labs (all labs ordered are listed, but only abnormal results are displayed) Labs Reviewed  COMPREHENSIVE METABOLIC PANEL WITH GFR - Abnormal; Notable for the following components:      Result Value   Glucose, Bld 101 (*)    All other  components within normal limits  URINALYSIS, ROUTINE W REFLEX MICROSCOPIC - Abnormal; Notable for the following components:   Color, Urine STRAW (*)    APPearance CLEAR (*)    Leukocytes,Ua MODERATE (*)    Bacteria, UA RARE (*)    All other components within normal limits  LIPASE, BLOOD  CBC  PREGNANCY, URINE    RADIOLOGY  CT abdomen pelvis pending.   PROCEDURES:  Critical Care performed: No  Procedures   MEDICATIONS ORDERED IN ED: Medications - No data to display   IMPRESSION / MDM / ASSESSMENT AND PLAN / ED COURSE  I reviewed the triage vital signs and the nursing notes.                             33 year old female presents for evaluation of right lower quadrant pain and diarrhea.  Vital signs are stable patient NAD on exam.  Differential diagnosis includes, but is not limited to, IBS flare, biliary disease, pancreatitis,  appendicitis, gastroenteritis, diverticulitis.   Patient's presentation is most consistent with acute complicated illness / injury requiring diagnostic workup.  Will obtain labs and urinalysis.  If labs, UA and imaging are reassuring feel patient would be stable for outpatient management.   Clinical Course as of 07/10/24 1543  Sun Jul 10, 2024  1352 CBC Unremarkable. No leukocytosis.  [LD]  1402 Comprehensive metabolic panel(!) Unremarkable. [LD]  1404 Lipase, blood Within normal limits.  [LD]  1418 Labs are reassuring, patient's vitals are stable. Suspect symptoms are due to a viral infection or IBS flare, however she does have RLQ which may be consistent with appendicitis. Discussed getting a CT scan given that patient was tender in the RLQ, patient would prefer to have imaging completed so will obtain CT abdomen pelvis.  [LD]  1446 Urinalysis, Routine w reflex microscopic -Urine, Clean Catch(!) Leukocytes present with rare bacteria but patient not having symptoms of UTI so will hold off on treatment.  [LD]  1500 Nursing staff unable to get  an IV started, IV team will be consulted. [LD]  1518 Care of the patient will be passed off to the oncoming provider, pending CT scan, who will make the final diagnosis and plan. [LD]    Clinical Course User Index [LD] Cleaster Tinnie LABOR, PA-C     FINAL CLINICAL IMPRESSION(S) / ED DIAGNOSES   Final diagnoses:  RLQ abdominal pain     Rx / DC Orders   ED Discharge Orders     None        Note:  This document was prepared using Dragon voice recognition software and may include unintentional dictation errors.   Cleaster Tinnie LABOR, PA-C 07/10/24 1543    Willo Dunnings, MD 07/10/24 [phone]

## 2024-07-10 NOTE — ED Notes (Signed)
 See triage note  Presents with some abd pain  States pain is mainly to RLQ  Has had some diarrhea for the past 3 days

## 2024-08-09 ENCOUNTER — Encounter: Payer: Self-pay | Admitting: Gastroenterology

## 2024-08-09 ENCOUNTER — Other Ambulatory Visit (INDEPENDENT_AMBULATORY_CARE_PROVIDER_SITE_OTHER)

## 2024-08-09 ENCOUNTER — Ambulatory Visit: Admitting: Gastroenterology

## 2024-08-09 VITALS — BP 114/78 | HR 73 | Ht 63.0 in | Wt 179.1 lb

## 2024-08-09 DIAGNOSIS — R1084 Generalized abdominal pain: Secondary | ICD-10-CM

## 2024-08-09 DIAGNOSIS — R143 Flatulence: Secondary | ICD-10-CM

## 2024-08-09 DIAGNOSIS — R140 Abdominal distension (gaseous): Secondary | ICD-10-CM

## 2024-08-09 DIAGNOSIS — K580 Irritable bowel syndrome with diarrhea: Secondary | ICD-10-CM

## 2024-08-09 LAB — C-REACTIVE PROTEIN: CRP: <1 mg/dL (ref 0.5–20.0)

## 2024-08-09 LAB — TSH: TSH: 2.75 u[IU]/mL (ref 0.35–5.50)

## 2024-08-09 MED ORDER — RIFAXIMIN 550 MG PO TABS: 550.0000 mg | ORAL_TABLET | Freq: Three times a day (TID) | ORAL | 0 refills | Status: DC

## 2024-08-09 MED ORDER — RIFAXIMIN 550 MG PO TABS: 550.0000 mg | ORAL_TABLET | Freq: Three times a day (TID) | ORAL | 2 refills | Status: AC

## 2024-08-09 NOTE — Progress Notes (Signed)
 Chief Complaint: abd pain, diarrhea, gas/bloat Primary GI Doctor:( Previously Dr. Aneita) Dr. Charlanne  HPI:  Patient is a  33  year old female patient with past medical history of IBS-D, who presents for follow-up on abdominal pain Patient last seen in the GI office on 04/01/2023 by Dr. Aneita for complaints of IBS-D with fecal urgency.  07/10/2024 patient seen in the ED for RLQ abdominal pain and diarrhea.  Labs show: Lipase 35, CMP normal, WBC 7.7, hemoglobin 13.6, platelets 307 CTAP showed no acute abnormality in the acute/pelvis.  Normal appendix.  Interval History   Patient presents for evaluation of acute abdominal pain. She describes it as a stabbing pain in her right side. She recalls on 7/17 she was on her way home and she had to stop twice to have diarrhea at gas station. She notes her symptoms with abdominal pain started shortly after this. The ED prescribed her bentyl  which did not seem to help with her symptoms. She does have history of IBS and a lot of food triggers she avoids such as soda, butter, and carrots. She reports she typically does not have pain with her IBS and this was new.  She takes glycopyrrolate  once to twice daily as needed and hyoscyamine  prn when she eats out at restaurants. She reports the abdominal pain has improved, but still a dull ache. She only has nausea if pain is severe. She also notes over the last two years her stool is odorous.   She reports right after pregnancy in Sept 2023 she did have issues with her thyroid  and was followed for a few months, it normalized over time. She reports she has been exercising on regular basis and follows a very strict diet. She notes she has had issues with losing weight.   Previous GI procedures 04/15/23 colonoscopy with Dr. Aneita - The examined portion of the ileum was normal. - 6 mm sessile transverse colon polyp. Removed and retrieved. - The entire examined colon was otherwise normal on direct and retroflexion views.  Biopsied. Path:  Surgical [P], colon nos, random sites COLONIC MUCOSA WITH NO SIGNIFICANT DIAGNOSTIC ALTERATION. NO EVIDENCE OF LYMPHOCYTIC COLITIS OR COLLAGENOUS COLITIS. NO EVIDENCE OF ACTIVITY, CHRONICITY, GRANULOMA, DYSPLASIA OR MALIGNANCY. 2. Surgical [P], colon, transverse, polyp (1) TUBULAR ADENOMA. NEGATIVE FOR HIGH-GRADE DYSPLASIA.  03/19/2018 EGD - Normal esophagus. - Normal stomach. - Normal examined duodenum. - No specimens collected.  Wt Readings from Last 3 Encounters:  08/09/24 179 lb 2 oz (81.3 kg)  07/10/24 177 lb (80.3 kg)  05/26/24 174 lb (78.9 kg)    Past Medical History:  Diagnosis Date   Allergic rhinitis    IBS (irritable bowel syndrome)    Lactose intolerance    symptamatic but not diagnosed   Medical history non-contributory    Rash     Past Surgical History:  Procedure Laterality Date   MOUTH SURGERY     x 3   TONSILLECTOMY     UPPER GASTROINTESTINAL ENDOSCOPY     WISDOM TOOTH EXTRACTION      Current Outpatient Medications  Medication Sig Dispense Refill   Ascorbic Acid (VITAMIN C GUMMIES PO) Take 250-500 mg by mouth daily.     FLUoxetine  (PROZAC ) 10 MG capsule Take 1 capsule (10 mg total) by mouth daily. 30 capsule 12   glycopyrrolate  (ROBINUL ) 2 MG tablet Take 2-4 mg by mouth daily as needed.     hyoscyamine  (LEVSIN ) 0.125 MG tablet Take 0.125 mg by mouth every 4 (four) hours as  needed.     levocetirizine (XYZAL) 5 MG tablet Take 5 mg by mouth daily.     VITAMIN D PO Take by mouth.     rifaximin  (XIFAXAN ) 550 MG TABS tablet Take 1 tablet (550 mg total) by mouth 3 (three) times daily for 14 days. 42 tablet 2   Current Facility-Administered Medications  Medication Dose Route Frequency Provider Last Rate Last Admin   0.9 %  sodium chloride  infusion  500 mL Intravenous Once Aneita Gwendlyn DASEN, MD        Allergies as of 08/09/2024 - Review Complete 08/09/2024  Allergen Reaction Noted   Doxylamine Other (See Comments) 03/15/2022   Adhesive  [tape]  02/01/2019   Other  02/01/2019   Sulfa antibiotics  02/01/2019   Nickel Rash 06/18/2020    Family History  Problem Relation Age of Onset   Colon polyps Mother    Irritable bowel syndrome Mother    Other Mother        removal of the gallbladder and heria surgery that flipped her stomach up side down   Non-Hodgkin's lymphoma Father    Colon polyps Father    Lymphoma Father    Transient ischemic attack Father    GER disease Brother    Dementia Maternal Grandmother    Heart disease Maternal Grandfather    Breast cancer Paternal Grandmother 30   Diabetes Paternal Grandfather    Diverticulitis Paternal Grandfather    Breast cancer Paternal Aunt 78       gene neg    Review of Systems:    Constitutional: No weight loss, fever, chills, weakness or fatigue HEENT: Eyes: No change in vision               Ears, Nose, Throat:  No change in hearing or congestion Skin: No rash or itching Cardiovascular: No chest pain, chest pressure or palpitations   Respiratory: No SOB or cough Gastrointestinal: See HPI and otherwise negative Genitourinary: No dysuria or change in urinary frequency Neurological: No headache, dizziness or syncope Musculoskeletal: No new muscle or joint pain Hematologic: No bleeding or bruising Psychiatric: No history of depression or anxiety    Physical Exam:  Vital signs: BP 114/78   Pulse 73   Ht 5' 3 (1.6 m)   Wt 179 lb 2 oz (81.3 kg)   LMP 06/29/2024 (Approximate)   BMI 31.73 kg/m   Constitutional:   Pleasant  female appears to be in NAD, Well developed, Well nourished, alert and cooperative Throat: Oral cavity and pharynx without inflammation, swelling or lesion.  Respiratory: Respirations even and unlabored. Lungs clear to auscultation bilaterally.   No wheezes, crackles, or rhonchi.  Cardiovascular: Normal S1, S2. Regular rate and rhythm. No peripheral edema, cyanosis or pallor.  Gastrointestinal:  Soft, nondistended, generalized abd  tenderness. No rebound or guarding. Normal bowel sounds. No appreciable masses or hepatomegaly. Rectal:  Not performed.  Msk:  Symmetrical without gross deformities. Without edema, no deformity or joint abnormality.  Neurologic:  Alert and  oriented x4;  grossly normal neurologically.  Skin:   Dry and intact without significant lesions or rashes.  RELEVANT LABS AND IMAGING: CBC    Latest Ref Rng & Units 07/10/2024    1:34 PM 09/03/2022    5:06 AM 09/02/2022    4:10 AM  CBC  WBC 4.0 - 10.5 K/uL 7.7  17.8  18.3   Hemoglobin 12.0 - 15.0 g/dL 86.3  9.6  87.2   Hematocrit 36.0 - 46.0 % 40.7  29.3  38.0   Platelets 150 - 400 K/uL 307  250  300      CMP     Latest Ref Rng & Units 07/10/2024    1:34 PM 03/10/2018    9:58 AM  CMP  Glucose 70 - 99 mg/dL 898  896   BUN 6 - 20 mg/dL 12  10   Creatinine 9.55 - 1.00 mg/dL 9.08  9.37   Sodium 864 - 145 mmol/L 136  139   Potassium 3.5 - 5.1 mmol/L 3.8  4.4   Chloride 98 - 111 mmol/L 103  105   CO2 22 - 32 mmol/L 22  26   Calcium 8.9 - 10.3 mg/dL 9.4  9.3   Total Protein 6.5 - 8.1 g/dL 7.7  8.2   Total Bilirubin 0.0 - 1.2 mg/dL 0.4  0.6   Alkaline Phos 38 - 126 U/L 61  57   AST 15 - 41 U/L 28  24   ALT 0 - 44 U/L 22  17      Lab Results  Component Value Date   TSH 2.83 05/05/2023   07/11/23 CTAP- IMPRESSION: No acute abnormality in the abdomen/pelvis. Normal appendix.  Assessment: Encounter Diagnoses  Name Primary?   Irritable bowel syndrome with diarrhea Yes   Bloating    Generalized abdominal pain    Flatulence      33 year old female patient with history of IBS who presents with acute episode of abdominal pain with diarrhea gas and bloat.  CT scan was normal with distended gallbladder.  Will follow-up with right upper quadrant ultrasound to rule out gallbladder disease.  Will also check celiac panel, inflammatory marker, and thyroid  level to rule out inflammatory disease or celiac.  Patient did complete colonoscopy April 2024 which  was normal.  Patient uses as needed meds for the diarrhea that overall work well.  Will do a trial of Xifaxan  to see if this helps with the gas and bloat. She already follows a very restricted diet.   Plan: - TTG IgA, IgA, CRP, TSH -RUQ abdominal ultrasound  -Will do trial of Xifaxan  -low fodmap diet  Thank you for the courtesy of this consult. Please call me with any questions or concerns.   Santonio Speakman, FNP-C [facility] 08/09/2024, 4:14 PM  Cc: Sheryle Carwin, MD

## 2024-08-09 NOTE — Patient Instructions (Addendum)
 Recommend low fodmap diet  Your provider has requested that you go to the basement level for lab work before leaving today. Press B on the elevator. The lab is located at the first door on the left as you exit the elevator.  You have been scheduled for an abdominal ultrasound at [facility] (1st floor of hospital) on ____ at ____. Please arrive 30 minutes prior to your appointment for registration. Make certain not to have anything to eat or drink 6 hours prior to your appointment. Should you need to reschedule your appointment, please contact radiology at [phone]. This test typically takes about 30 minutes to perform.  _______________________________________________________  If your blood pressure at your visit was 140/90 or greater, please contact your primary care physician to follow up on this.  _______________________________________________________  If you are age 33 or older, your body mass index should be between 23-30. Your Body mass index is 31.73 kg/m. If this is out of the aforementioned range listed, please consider follow up with your Primary Care Provider.  If you are age 4 or younger, your body mass index should be between 19-25. Your Body mass index is 31.73 kg/m. If this is out of the aformentioned range listed, please consider follow up with your Primary Care Provider.   ________________________________________________________  The [facility] providers would like to encourage you to use MYCHART to communicate with providers for non-urgent requests or questions.  Due to long hold times on the telephone, sending your provider a message by [facility] may be a faster and more efficient way to get a response.  Please allow 48 business hours for a response.  Please remember that this is for non-urgent requests.  _______________________________________________________  [facility] is using a team-based approach to care.  Your team is made up of your  doctor and two to three APPS. Our APPS (Nurse Practitioners and Physician Assistants) work with your physician to ensure care continuity for you. They are fully qualified to address your health concerns and develop a treatment plan. They communicate directly with your gastroenterologist to care for you. Seeing the Advanced Practice Practitioners on your physician's team can help you by facilitating care more promptly, often allowing for earlier appointments, access to diagnostic testing, procedures, and other specialty referrals.   Thank you for trusting me with your gastrointestinal care. Deanna May, FNP-C

## 2024-08-10 LAB — TISSUE TRANSGLUTAMINASE ABS,IGG,IGA
(tTG) Ab, IgA: 1 U/mL
(tTG) Ab, IgG: <1 U/mL

## 2024-08-10 LAB — IGA: Immunoglobulin A: 241 mg/dL (ref 47–310)

## 2024-08-11 ENCOUNTER — Ambulatory Visit: Payer: Self-pay | Admitting: Gastroenterology

## 2024-08-13 ENCOUNTER — Ambulatory Visit (HOSPITAL_COMMUNITY)
Admission: RE | Admit: 2024-08-13 | Discharge: 2024-08-13 | Disposition: A | Discharge status: Home / Self Care | Source: Ambulatory Visit | Attending: Gastroenterology | Admitting: Gastroenterology

## 2024-08-13 DIAGNOSIS — R143 Flatulence: Secondary | ICD-10-CM | POA: Diagnosis present

## 2024-08-13 DIAGNOSIS — R140 Abdominal distension (gaseous): Secondary | ICD-10-CM | POA: Insufficient documentation

## 2024-08-13 DIAGNOSIS — R1084 Generalized abdominal pain: Secondary | ICD-10-CM | POA: Insufficient documentation

## 2024-08-13 DIAGNOSIS — K580 Irritable bowel syndrome with diarrhea: Secondary | ICD-10-CM | POA: Diagnosis present

## 2024-08-16 ENCOUNTER — Telehealth: Payer: Self-pay | Admitting: Gastroenterology

## 2024-08-16 NOTE — Telephone Encounter (Signed)
 Left message for patient to return call to schedule 3 month follow up with Deanna May, NP. Appointment should be in November.

## 2024-10-17 ENCOUNTER — Other Ambulatory Visit: Payer: Self-pay | Admitting: Gastroenterology

## 2024-10-17 DIAGNOSIS — K580 Irritable bowel syndrome with diarrhea: Secondary | ICD-10-CM

## 2024-10-17 MED ORDER — GLYCOPYRROLATE 2 MG PO TABS: 2.0000 mg | ORAL_TABLET | Freq: Three times a day (TID) | ORAL | 5 refills | Status: AC | PRN

## 2025-01-05 ENCOUNTER — Ambulatory Visit

## 2025-01-05 VITALS — BP 128/75 | HR 75 | Wt 180.0 lb

## 2025-01-05 DIAGNOSIS — N912 Amenorrhea, unspecified: Secondary | ICD-10-CM

## 2025-01-05 DIAGNOSIS — Z3201 Encounter for pregnancy test, result positive: Secondary | ICD-10-CM

## 2025-01-05 DIAGNOSIS — Z3490 Encounter for supervision of normal pregnancy, unspecified, unspecified trimester: Secondary | ICD-10-CM

## 2025-01-05 DIAGNOSIS — Z3480 Encounter for supervision of other normal pregnancy, unspecified trimester: Secondary | ICD-10-CM

## 2025-01-05 LAB — POCT URINE PREGNANCY: Preg Test, Ur: POSITIVE — AB

## 2025-01-05 NOTE — Progress Notes (Signed)
" ° ° °  NURSE VISIT NOTE  Subjective:    Patient ID: Janet Gilmore, female    DOB: 25-Apr-1991, 34 y.o.   MRN: [id number]  HPI  Patient is a 34 y.o. G1P1001 female who presents for evaluation of amenorrhea. She believes she could be pregnant. Patient is ambivalent about pregnancy. Sexual Activity: single partner, contraception: coitus interruptus. Current symptoms also include: breast tenderness and positive home pregnancy test. Last period was normal.    Objective:    BP 128/75   Pulse 75   Wt 180 lb (81.6 kg)   LMP 11/30/2024 (Exact Date)   BMI 31.89 kg/m   Lab Review  Results for orders placed or performed in visit on 01/05/25  POCT urine pregnancy  Result Value Ref Range   Preg Test, Ur Positive (A) Negative    Assessment:   1. Amenorrhea   2. Pregnancy with uncertain dates, antepartum   3. Supervision of other normal pregnancy, antepartum     Plan:   Pregnancy Test: Positive  Encouraged well-balanced diet, plenty of rest when needed, pre-natal vitamins daily and walking for exercise.  Discussed self-help for nausea, avoiding OTC medications until consulting provider or pharmacist, other than Tylenol  as needed, minimal caffeine (1-2 cups daily) and avoiding alcohol.   She will schedule her nurse visit @ 7-[redacted] wks pregnant, u/s for dating @10  wk, and NOB visit at [redacted] wk pregnant.    Feel free to call with any questions.     Rollo FORBES Louder, RN   "

## 2025-01-05 NOTE — Patient Instructions (Signed)

## 2025-01-27 ENCOUNTER — Ambulatory Visit

## 2025-01-27 VITALS — BP 128/76 | HR 71 | Wt 175.0 lb

## 2025-01-27 DIAGNOSIS — Z3481 Encounter for supervision of other normal pregnancy, first trimester: Secondary | ICD-10-CM | POA: Insufficient documentation

## 2025-01-27 DIAGNOSIS — O219 Vomiting of pregnancy, unspecified: Secondary | ICD-10-CM

## 2025-01-27 MED ORDER — ONDANSETRON 4 MG PO TBDP: 4.0000 mg | ORAL_TABLET | Freq: Four times a day (QID) | ORAL | 0 refills | Status: AC | PRN

## 2025-01-27 NOTE — Progress Notes (Unsigned)
 New OB Intake  I connected with  Janet Gilmore on 01/27/25 at  3:15 PM EST in person.  I explained I am completing New OB Intake today. We discussed her EDD of 09/06/2025 that is based on LMP of 11/30/2024. Pt is G2/P1. I reviewed her allergies, medications, Medical/Surgical/OB history, and appropriate screenings. There are cats in the home: no. If yes:  Based on history, this is a/an pregnancy uncomplicated . Her obstetrical history is significant for N/A.  Patient Active Problem List   Diagnosis Date Noted   Irritable mood 02/04/2023   IBS (irritable bowel syndrome) 07/15/2022    Concerns addressed today:   Delivery Plans:  Plans to deliver at [facility].  Anatomy US  Explained first scheduled US  will be 02/08/2025. Anatomy US  will be scheduled around [redacted] weeks gestational age.  Labs Discussed genetic screening with patient. Patient does NOT WANT genetic testing to be drawn at new OB visit. Discussed possible labs to be drawn at new OB appointment.  COVID Vaccine Patient has had COVID vaccine.   Social Determinants of Health Food Insecurity: denies food insecurity Childcare: Discussed no children allowed at ultrasound appointments.   First visit review I reviewed new OB appt with pt. I explained she will have blood work and pap smear/pelvic exam if indicated. Explained pt will be seen by Janet Gilmore, CNM  at first visit; encounter routed to appropriate provider.   Burnard LITTIE Ro, CMA 01/27/2025  3:21 PM

## 2025-02-08 ENCOUNTER — Other Ambulatory Visit

## 2025-02-24 ENCOUNTER — Encounter: Admitting: Licensed Practical Nurse
